# Patient Record
Sex: Male | Born: 1955 | Race: White | Hispanic: No | Marital: Married | Smoking: Former smoker
Health system: Southern US, Community
[De-identification: ages and names within clinical notes are randomized; demographics above are authoritative.]

## PROBLEM LIST (undated history)

## (undated) DIAGNOSIS — E785 Hyperlipidemia, unspecified: Secondary | ICD-10-CM

## (undated) DIAGNOSIS — D649 Anemia, unspecified: Secondary | ICD-10-CM

## (undated) DIAGNOSIS — I4510 Unspecified right bundle-branch block: Secondary | ICD-10-CM

## (undated) DIAGNOSIS — R0989 Other specified symptoms and signs involving the circulatory and respiratory systems: Secondary | ICD-10-CM

## (undated) DIAGNOSIS — I2510 Atherosclerotic heart disease of native coronary artery without angina pectoris: Secondary | ICD-10-CM

## (undated) HISTORY — DX: Atherosclerotic heart disease of native coronary artery without angina pectoris: I25.10

## (undated) HISTORY — DX: Unspecified right bundle-branch block: I45.10

## (undated) HISTORY — DX: Hyperlipidemia, unspecified: E78.5

## (undated) HISTORY — DX: Anemia, unspecified: D64.9

## (undated) HISTORY — DX: Other specified symptoms and signs involving the circulatory and respiratory systems: R09.89

## (undated) HISTORY — PX: TONSILLECTOMY: SUR1361

## (undated) HISTORY — PX: CARDIAC CATHETERIZATION: SHX172

---

## 1999-05-11 ENCOUNTER — Emergency Department (HOSPITAL_COMMUNITY): Admission: EM | Admit: 1999-05-11 | Discharge: 1999-05-11 | Payer: Self-pay | Admitting: Internal Medicine

## 1999-05-11 ENCOUNTER — Encounter: Payer: Self-pay | Admitting: Internal Medicine

## 2001-08-02 ENCOUNTER — Encounter: Payer: Self-pay | Admitting: Family Medicine

## 2001-08-02 ENCOUNTER — Ambulatory Visit (HOSPITAL_COMMUNITY): Admission: RE | Admit: 2001-08-02 | Discharge: 2001-08-02 | Payer: Self-pay | Admitting: Family Medicine

## 2002-01-06 HISTORY — PX: CORONARY ANGIOPLASTY: SHX604

## 2006-03-17 ENCOUNTER — Encounter: Admission: RE | Admit: 2006-03-17 | Discharge: 2006-03-17 | Payer: Self-pay | Admitting: Cardiology

## 2006-03-19 ENCOUNTER — Ambulatory Visit (HOSPITAL_COMMUNITY): Admission: RE | Admit: 2006-03-19 | Discharge: 2006-03-20 | Payer: Self-pay | Admitting: Cardiology

## 2007-11-25 IMAGING — CR DG CHEST 2V
2 series · 2 of 2 positions shown · non-contrast
Comparison: None.

CLINICAL DATA: Angina.
 CHEST - TWO VIEWS:

[view not recorded (1 of 2)]
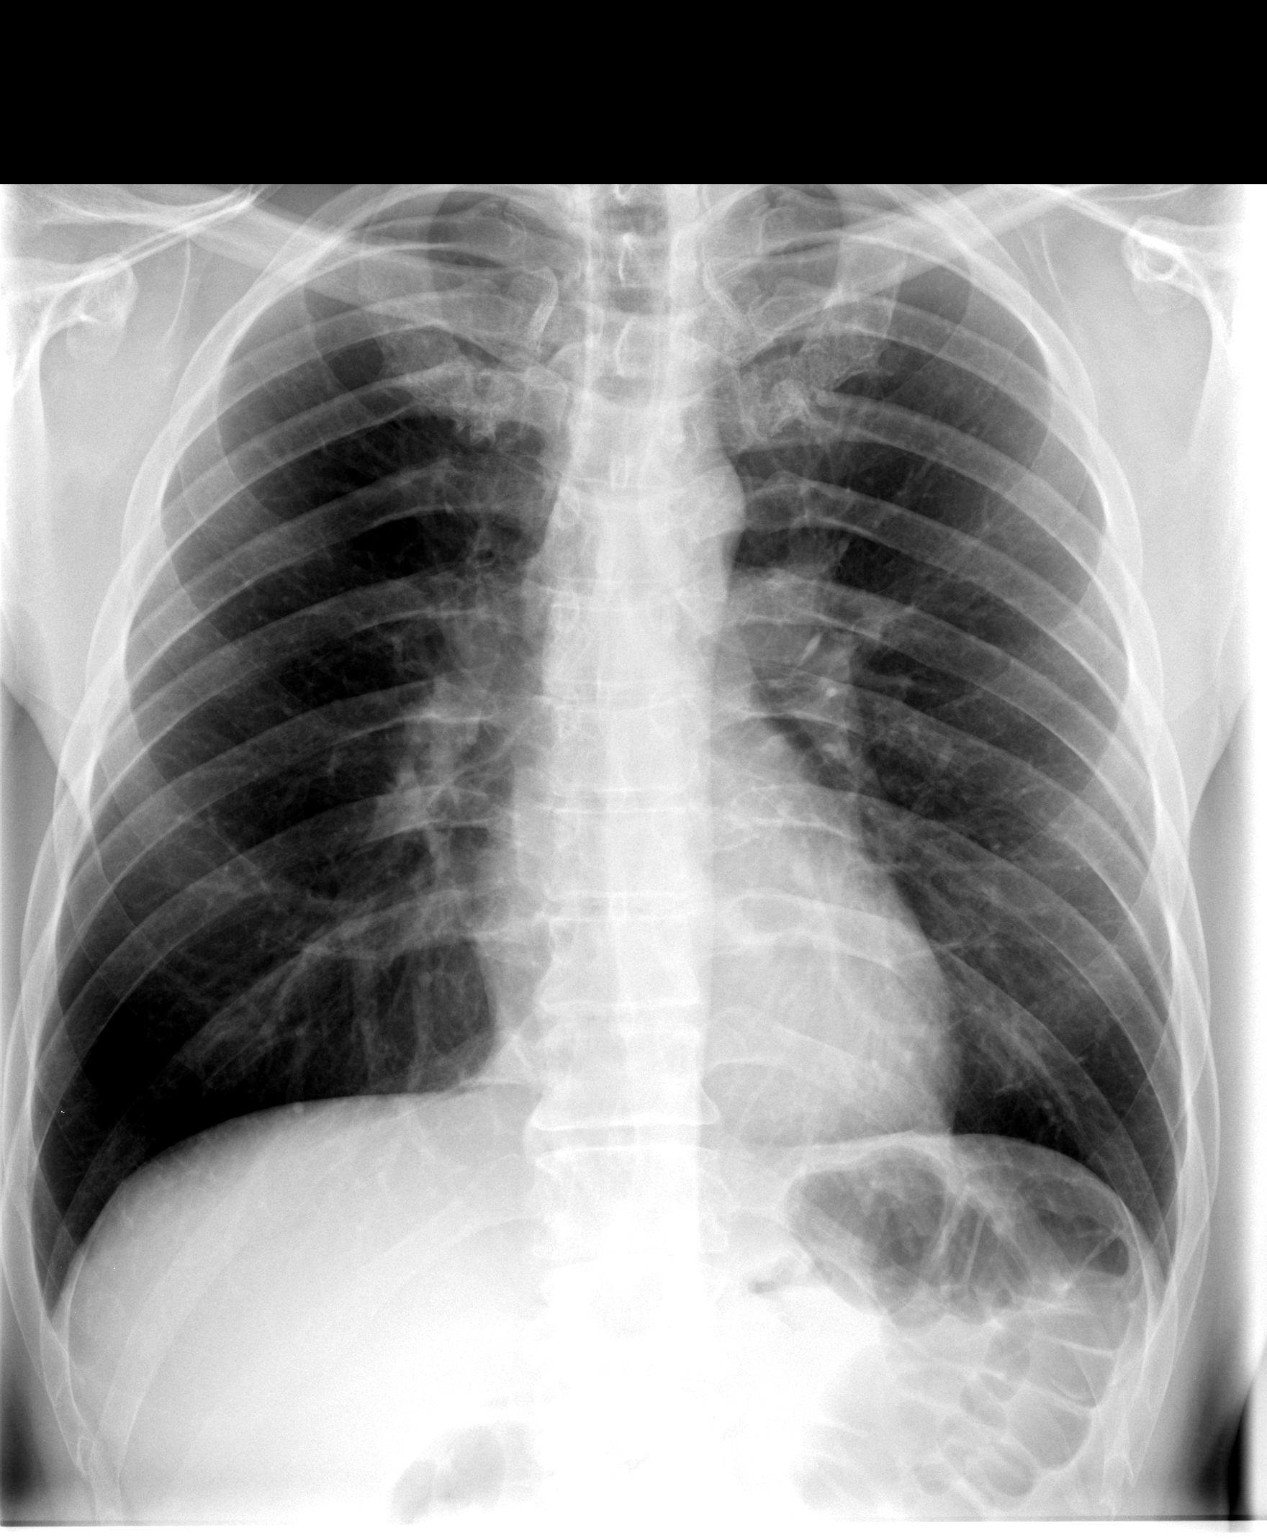

[view not recorded (2 of 2)]
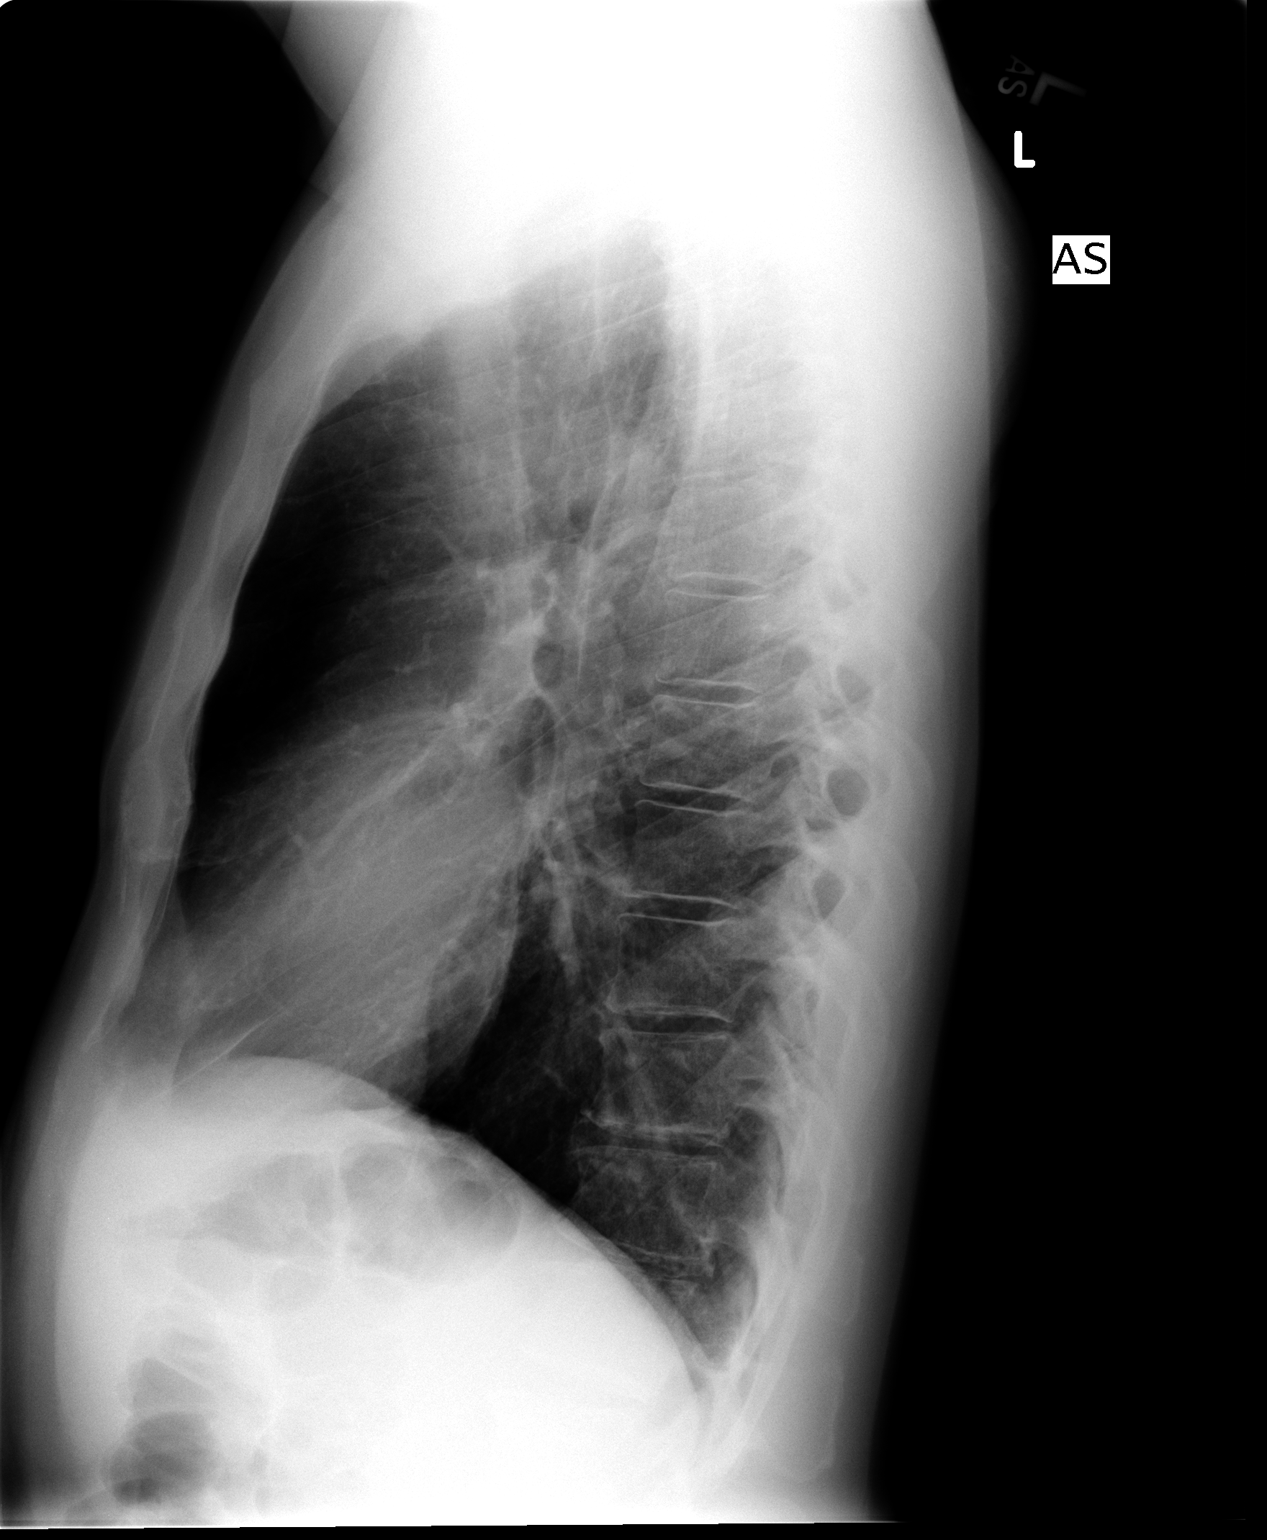

[2 of 2 positions shown; findings below may reference images not displayed]

FINDINGS: .  The lungs are hyperinflated and clear.  The cardiomediastinal silhouette is within normal limits.  There are no pneumothoraces or effusions seen.  The thoracic spine is intact.
IMPRESSION: Hyperinflation, a feature of COPD.

## 2012-10-09 ENCOUNTER — Encounter: Payer: Self-pay | Admitting: *Deleted

## 2012-10-09 ENCOUNTER — Encounter: Payer: Self-pay | Admitting: Dermatology

## 2012-10-09 DIAGNOSIS — I2510 Atherosclerotic heart disease of native coronary artery without angina pectoris: Secondary | ICD-10-CM | POA: Insufficient documentation

## 2012-10-09 DIAGNOSIS — E785 Hyperlipidemia, unspecified: Secondary | ICD-10-CM | POA: Insufficient documentation

## 2012-10-09 DIAGNOSIS — R0989 Other specified symptoms and signs involving the circulatory and respiratory systems: Secondary | ICD-10-CM | POA: Insufficient documentation

## 2012-10-14 ENCOUNTER — Ambulatory Visit: Payer: Self-pay | Admitting: Cardiology

## 2012-10-16 ENCOUNTER — Encounter: Payer: Self-pay | Admitting: Cardiology

## 2012-10-20 ENCOUNTER — Ambulatory Visit: Payer: Self-pay | Admitting: Cardiology

## 2012-11-15 ENCOUNTER — Encounter: Payer: Self-pay | Admitting: Cardiology

## 2012-11-15 ENCOUNTER — Ambulatory Visit (INDEPENDENT_AMBULATORY_CARE_PROVIDER_SITE_OTHER): Payer: BC Managed Care – PPO | Admitting: Cardiology

## 2012-11-15 VITALS — BP 120/64 | HR 66 | Ht 69.0 in | Wt 142.0 lb

## 2012-11-15 DIAGNOSIS — I2510 Atherosclerotic heart disease of native coronary artery without angina pectoris: Secondary | ICD-10-CM

## 2012-11-15 DIAGNOSIS — E785 Hyperlipidemia, unspecified: Secondary | ICD-10-CM

## 2012-11-15 NOTE — Progress Notes (Signed)
  [address] [place], [place]  [address] Phone: [phone] Fax:  [phone]  Date:  11/15/2012   ID:  Donald Fleming, DOB 1955-04-14, MRN [id number]  PCP:  Lolita Patella, MD  Cardiologist:  Armanda Magic, MD     History of Present Illness: Donald Fleming is a 57 y.o. male with a history of ASCAD, HTN and dyslipidemia who presents today for followup.  He denies any chest pain, SOB, DOE, LE edema, palpitations or syncope.  Occasionally he will have some feelings of off balance with the room that seems to come with changes in weather.  He walks for exercise.   Wt Readings from Last 3 Encounters:  11/15/12 142 lb (64.411 kg)     Past Medical History  Diagnosis Date  . Hypercholesteremia   . Coronary atherosclerosis of native coronary artery   . CAD (coronary artery disease)     status post PCI of the LAD diagonal with residual nonobstructive disease of the RCA/80% proximal stenosis a very small ramus  . Dyslipidemia   . Bruit     Current Outpatient Prescriptions  Medication Sig Dispense Refill  . aspirin 325 MG tablet Take 325 mg by mouth daily.      Marland Kitchen atorvastatin (LIPITOR) 40 MG tablet Take 40 mg by mouth daily.      . clopidogrel (PLAVIX) 75 MG tablet Take 75 mg by mouth daily.      . vitamin C (ASCORBIC ACID) 500 MG tablet Take 500 mg by mouth daily.       No current facility-administered medications for this visit.    Allergies:   No Known Allergies  Social History:  The patient  reports that he has quit smoking. He does not have any smokeless tobacco history on file.   Family History:  The patient's family history includes Diabetes in his sister; Heart attack in his father; Heart disease in his father and mother; Pulmonary embolism in his father.   ROS:  Please see the history of present illness.      All other systems reviewed and negative.   PHYSICAL EXAM: VS:  BP 120/64  Pulse 66  Ht 5\' 9"  (1.753 m)  Wt 142 lb (64.411 kg)  BMI 20.96  kg/m2 Well nourished, well developed, in no acute distress HEENT: normal Neck: no JVD Cardiac:  normal S1, S2; RRR; no murmur Lungs:  clear to auscultation bilaterally, no wheezing, rhonchi or rales Abd: soft, nontender, no hepatomegaly Ext: no edema Skin: warm and dry Neuro:  CNs 2-12 intact, no focal abnormalities noted  EKG:  NSR with IRBBB     ASSESSMENT AND PLAN:  1. ASCAD with no angina  - continue ASA/Plaivx 2. Dyslipidemia - LDL was 49  04/2012  - continue Atorvastatin Followup with me in 1 year  Signed, Armanda Magic, MD 11/15/2012 3:39 PM

## 2012-11-15 NOTE — Patient Instructions (Signed)
Your physician recommends that you continue on your current medications as directed. Please refer to the Current Medication list given to you today.  Your physician wants you to follow-up in: 1 year with Dr. Turner. You will receive a reminder letter in the mail two months in advance. If you don't receive a letter, please call our office to schedule the follow-up appointment.  

## 2013-01-25 ENCOUNTER — Other Ambulatory Visit: Payer: Self-pay | Admitting: Cardiology

## 2013-02-23 ENCOUNTER — Telehealth: Payer: Self-pay | Admitting: Cardiology

## 2013-02-23 ENCOUNTER — Encounter: Payer: Self-pay | Admitting: General Surgery

## 2013-02-23 NOTE — Telephone Encounter (Signed)
Spoke to pt and gave him phone number for mychart support.

## 2013-02-23 NOTE — Telephone Encounter (Signed)
New message     Can't get into mychart

## 2013-02-23 NOTE — Telephone Encounter (Signed)
Printed letter with new code and mailed to pt. Pt is aware.

## 2013-02-23 NOTE — Telephone Encounter (Signed)
Follow up    Need activation code for my chart.  OK to leave on vm

## 2013-03-10 ENCOUNTER — Telehealth: Payer: Self-pay | Admitting: Cardiology

## 2013-03-10 NOTE — Telephone Encounter (Signed)
New Message   Pt is requesting a call back to discuss current BP and cholesterol levels// Please call

## 2013-03-11 NOTE — Telephone Encounter (Signed)
Spoke with pt and gave him results that was needed.

## 2013-03-14 ENCOUNTER — Other Ambulatory Visit: Payer: Self-pay | Admitting: Cardiology

## 2013-04-07 ENCOUNTER — Ambulatory Visit (INDEPENDENT_AMBULATORY_CARE_PROVIDER_SITE_OTHER): Payer: BC Managed Care – PPO | Admitting: *Deleted

## 2013-04-07 DIAGNOSIS — I2510 Atherosclerotic heart disease of native coronary artery without angina pectoris: Secondary | ICD-10-CM

## 2013-04-07 DIAGNOSIS — E785 Hyperlipidemia, unspecified: Secondary | ICD-10-CM

## 2013-04-07 DIAGNOSIS — E7800 Pure hypercholesterolemia, unspecified: Secondary | ICD-10-CM

## 2013-04-07 LAB — HEPATIC FUNCTION PANEL
ALBUMIN: 3.1 g/dL — AB (ref 3.5–5.2)
ALT: 13 U/L (ref 0–53)
AST: 14 U/L (ref 0–37)
Alkaline Phosphatase: 73 U/L (ref 39–117)
Bilirubin, Direct: 0.1 mg/dL (ref 0.0–0.3)
TOTAL PROTEIN: 7.1 g/dL (ref 6.0–8.3)
Total Bilirubin: 0.7 mg/dL (ref 0.3–1.2)

## 2013-04-07 LAB — LIPID PANEL
CHOL/HDL RATIO: 2
CHOLESTEROL: 102 mg/dL (ref 0–200)
HDL: 42.2 mg/dL (ref >39.00)
LDL Cholesterol: 56 mg/dL (ref 0–99)
TRIGLYCERIDES: 18 mg/dL (ref 0.0–149.0)
VLDL: 3.6 mg/dL (ref 0.0–40.0)

## 2013-04-12 ENCOUNTER — Other Ambulatory Visit: Payer: Self-pay | Admitting: General Surgery

## 2013-04-12 DIAGNOSIS — E7800 Pure hypercholesterolemia, unspecified: Secondary | ICD-10-CM

## 2013-08-15 ENCOUNTER — Other Ambulatory Visit: Payer: Self-pay | Admitting: Cardiology

## 2013-08-21 ENCOUNTER — Encounter: Payer: Self-pay | Admitting: *Deleted

## 2013-10-11 ENCOUNTER — Other Ambulatory Visit: Payer: Self-pay | Admitting: Cardiology

## 2013-10-12 ENCOUNTER — Other Ambulatory Visit (INDEPENDENT_AMBULATORY_CARE_PROVIDER_SITE_OTHER): Payer: BC Managed Care – PPO | Admitting: *Deleted

## 2013-10-12 DIAGNOSIS — E7800 Pure hypercholesterolemia, unspecified: Secondary | ICD-10-CM

## 2013-10-12 LAB — LIPID PANEL
CHOL/HDL RATIO: 3
Cholesterol: 86 mg/dL (ref 0–200)
HDL: 33.7 mg/dL — ABNORMAL LOW (ref >39.00)
LDL CALC: 48 mg/dL (ref 0–99)
NONHDL: 52.3
Triglycerides: 20 mg/dL (ref 0.0–149.0)
VLDL: 4 mg/dL (ref 0.0–40.0)

## 2013-10-12 LAB — HEPATIC FUNCTION PANEL
ALT: 11 U/L (ref 0–53)
AST: 16 U/L (ref 0–37)
Albumin: 2.7 g/dL — ABNORMAL LOW (ref 3.5–5.2)
Alkaline Phosphatase: 69 U/L (ref 39–117)
Bilirubin, Direct: 0.1 mg/dL (ref 0.0–0.3)
Total Bilirubin: 0.6 mg/dL (ref 0.2–1.2)
Total Protein: 7 g/dL (ref 6.0–8.3)

## 2013-10-13 ENCOUNTER — Telehealth: Payer: Self-pay

## 2013-10-13 MED ORDER — ATORVASTATIN CALCIUM 40 MG PO TABS: 40.0000 mg | ORAL_TABLET | Freq: Every day | ORAL | Status: DC

## 2013-10-13 MED ORDER — CLOPIDOGREL BISULFATE 75 MG PO TABS: 75.0000 mg | ORAL_TABLET | Freq: Every day | ORAL | Status: DC

## 2013-10-13 NOTE — Telephone Encounter (Signed)
Given patient lab results. Also refilled his lipitor 40 mg and Plavix 75mg  for 6 months.

## 2013-11-16 ENCOUNTER — Ambulatory Visit: Payer: BC Managed Care – PPO | Admitting: Cardiology

## 2013-11-25 ENCOUNTER — Ambulatory Visit (INDEPENDENT_AMBULATORY_CARE_PROVIDER_SITE_OTHER): Payer: BC Managed Care – PPO | Admitting: Cardiology

## 2013-11-25 ENCOUNTER — Encounter: Payer: Self-pay | Admitting: Cardiology

## 2013-11-25 VITALS — BP 110/62 | HR 68 | Ht 69.0 in | Wt 142.0 lb

## 2013-11-25 DIAGNOSIS — E7800 Pure hypercholesterolemia, unspecified: Secondary | ICD-10-CM

## 2013-11-25 DIAGNOSIS — I2510 Atherosclerotic heart disease of native coronary artery without angina pectoris: Secondary | ICD-10-CM

## 2013-11-25 DIAGNOSIS — E785 Hyperlipidemia, unspecified: Secondary | ICD-10-CM

## 2013-11-25 DIAGNOSIS — I2583 Coronary atherosclerosis due to lipid rich plaque: Principal | ICD-10-CM

## 2013-11-25 MED ORDER — ASPIRIN 81 MG PO TABS: 81.0000 mg | ORAL_TABLET | Freq: Every day | ORAL | Status: DC

## 2013-11-25 NOTE — Patient Instructions (Signed)
Your physician has recommended you make the following change in your medication:  1) DECREASE Aspirin to 81 mg daily.  Your physician wants you to follow-up in: 1 year with Dr. Mayford Knifeurner. You will receive a reminder letter in the mail two months in advance. If you don't receive a letter, please call our office to schedule the follow-up appointment.

## 2013-11-25 NOTE — Progress Notes (Signed)
  [address] [place], [place]  [address] Phone: [phone](336) [phone] Fax:  [phone]) [phone]  Date:  11/25/2013   ID:  Donald Fleming, DOB 06/08/1955, MRN [id number]  PCP:  Lolita PatellaEADE,ROBERT ALEXANDER, MD  Cardiologist:  Armanda Magicraci Turner, MD    History of Present Illness: Donald Fleming is a 58 y.o. male with a history of ASCAD, HTN and dyslipidemia who presents today for followup. He denies any chest pain, SOB, DOE, LE edema, palpitations or syncope.  He walks for exercise.   Wt Readings from Last 3 Encounters:  11/15/12 142 lb (64.411 kg)     Past Medical History  Diagnosis Date  . Hypercholesteremia   . Coronary atherosclerosis of native coronary artery   . CAD (coronary artery disease)     status post PCI of the LAD diagonal with residual nonobstructive disease of the RCA/80% proximal stenosis a very small ramus  . Dyslipidemia   . Bruit     Current Outpatient Prescriptions  Medication Sig Dispense Refill  . aspirin 325 MG tablet Take 325 mg by mouth daily.    Marland Kitchen. atorvastatin (LIPITOR) 40 MG tablet Take 1 tablet (40 mg total) by mouth daily at 6 PM. 30 tablet 5  . clopidogrel (PLAVIX) 75 MG tablet Take 1 tablet (75 mg total) by mouth daily. 30 tablet 5  . vitamin C (ASCORBIC ACID) 500 MG tablet Take 500 mg by mouth daily.     No current facility-administered medications for this visit.    Allergies:   No Known Allergies  Social History:  The patient  reports that he has quit smoking. He does not have any smokeless tobacco history on file.   Family History:  The patient's family history includes Diabetes in his sister; Heart attack in his father; Heart disease in his father and mother; Pulmonary embolism in his father.   ROS:  Please see the history of present illness.      All other systems reviewed and negative.   PHYSICAL EXAM: VS:  There were no vitals taken for this visit. Well nourished, well developed, in no acute distress HEENT: normal Neck: no JVD Cardiac:   normal S1, S2; RRR; no murmur Lungs:  clear to auscultation bilaterally, no wheezing, rhonchi or rales Abd: soft, nontender, no hepatomegaly Ext: no edema Skin: warm and dry Neuro:  CNs 2-12 intact, no focal abnormalities noted  EKG:     NSR with no ST changes  ASSESSMENT AND PLAN:  1. ASCAD with no angina - continue ASA/Plavix  - decrease ASA to 81mg  daily 2. Dyslipidemia - LDL was 48 10/2013 - continue Atorvastatin Followup with me in 1 year   Signed, Armanda Magicraci Turner, MD [facility] 11/25/2013 9:47 AM

## 2014-01-06 DIAGNOSIS — D649 Anemia, unspecified: Secondary | ICD-10-CM

## 2014-01-06 HISTORY — DX: Anemia, unspecified: D64.9

## 2014-06-09 ENCOUNTER — Other Ambulatory Visit: Payer: Self-pay | Admitting: Cardiology

## 2014-08-07 HISTORY — PX: COLONOSCOPY: SHX174

## 2014-08-16 ENCOUNTER — Encounter: Payer: Self-pay | Admitting: Cardiology

## 2014-09-12 ENCOUNTER — Telehealth: Payer: Self-pay | Admitting: *Deleted

## 2014-09-12 ENCOUNTER — Ambulatory Visit (INDEPENDENT_AMBULATORY_CARE_PROVIDER_SITE_OTHER): Payer: BC Managed Care – PPO | Admitting: Cardiology

## 2014-09-12 ENCOUNTER — Encounter: Payer: Self-pay | Admitting: *Deleted

## 2014-09-12 ENCOUNTER — Encounter: Payer: Self-pay | Admitting: Cardiology

## 2014-09-12 VITALS — BP 96/44 | HR 82 | Ht 68.0 in | Wt 135.1 lb

## 2014-09-12 DIAGNOSIS — I2510 Atherosclerotic heart disease of native coronary artery without angina pectoris: Secondary | ICD-10-CM

## 2014-09-12 DIAGNOSIS — E785 Hyperlipidemia, unspecified: Secondary | ICD-10-CM

## 2014-09-12 DIAGNOSIS — K4021 Bilateral inguinal hernia, without obstruction or gangrene, recurrent: Secondary | ICD-10-CM | POA: Diagnosis not present

## 2014-09-12 DIAGNOSIS — I2583 Coronary atherosclerosis due to lipid rich plaque: Principal | ICD-10-CM

## 2014-09-12 NOTE — Patient Instructions (Addendum)
Medication Instructions:  Your physician recommends that you continue on your current medications as directed. Please refer to the Current Medication list given to you today.   Labwork: None ordered  Testing/Procedures: None ordered  Follow-Up: Your physician wants you to follow-up in:  2 - 3 months with Dr. Mayford Knife.   You have been cleared for your surgery.  Dr. Maris Berger office will be notified.

## 2014-09-12 NOTE — Progress Notes (Signed)
Cardiology Office Note   Date:  09/12/2014   ID:  Donald Fleming, DOB April 28, 1955, MRN [id number]  PCP:  Lolita Patella, MD  Cardiologist:  Dr. Mayford Knife    Chief Complaint  Patient presents with  . Pre-op Exam    hx CAD, no chest pain      History of Present Illness: Donald Fleming is a 59 y.o. male who presents for cardiac risk eval for Lt inguinal repair and placement of mesh by Dr. Abbey Chatters.    Pt with a history of ASCAD with PCI of LAD, and residual nonobstructive disease of RCA 80% in 2004, HTN and dyslipidemia who presents today for risk stratification for inguinal hernia repair. He denies any chest pain, SOB, DOE, LE edema, palpitations or syncope. He has done very well since 2004.  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  He has no pain or SOB with these activities.    Past Medical History  Diagnosis Date  . Hypercholesteremia   . Coronary atherosclerosis of native coronary artery   . CAD (coronary artery disease)     status post PCI of the LAD diagonal with residual nonobstructive disease of the RCA/80% proximal stenosis a very small ramus  . Dyslipidemia   . Bruit     History reviewed. No pertinent past surgical history.   Current Outpatient Prescriptions  Medication Sig Dispense Refill  . aspirin 81 MG tablet Take 1 tablet (81 mg total) by mouth daily.    Marland Kitchen atorvastatin (LIPITOR) 40 MG tablet TAKE ONE (1) TABLET BY MOUTH EVERY DAY AT 6PM 30 tablet 3  . clopidogrel (PLAVIX) 75 MG tablet TAKE ONE (1) TABLET BY MOUTH EVERY DAY 30 tablet 3  . vitamin C (ASCORBIC ACID) 500 MG tablet Take 500 mg by mouth daily.     No current facility-administered medications for this visit.    Allergies:   Review of patient's allergies indicates no known allergies.    Social History:  The patient  reports that he has quit smoking. He does not have any smokeless tobacco history on file.   Family History:  The patient's family history includes Diabetes  in his sister; Heart attack in his father; Heart disease in his father and mother; Pulmonary embolism in his father.    ROS:  General:no colds or fevers, some weight loss Skin:no rashes or ulcers HEENT:no blurred vision, no congestion CV:see HPI PUL:see HPI GI:no diarrhea constipation or melena, no indigestion GU:no hematuria, no dysuria MS:no joint pain, no claudication Neuro:no syncope, no lightheadedness Endo:no diabetes, no thyroid disease  Wt Readings from Last 3 Encounters:  09/12/14 135 lb 1.9 oz (61.29 kg)  11/25/13 142 lb (64.411 kg)  11/15/12 142 lb (64.411 kg)     PHYSICAL EXAM: VS:  BP 96/44 mmHg  Pulse 82  Ht 5\' 8"  (1.727 m)  Wt 135 lb 1.9 oz (61.29 kg)  BMI 20.55 kg/m2  SpO2 97% , BMI Body mass index is 20.55 kg/(m^2). General:Pleasant affect, NAD Skin:Warm and dry, brisk capillary refill HEENT:normocephalic, sclera clear, mucus membranes moist Neck:supple, no JVD, no bruits  Heart:S1S2 RRR with soft systolic murmur 1/VI, no gallup, rub or click Lungs:clear without rales, rhonchi, or wheezes NWG:NFAO, non tender, + BS, do not palpate liver spleen or masses Ext:no lower ext edema, 2+ pedal pulses, 2+ radial pulses Neuro:alert and oriented, MAE, follows commands, + facial symmetry    EKG:  EKG is ordered today. The ekg ordered today demonstrates SR with incomplete  RBBB and no changes from EKG 11/2013.     Recent Labs: 10/12/2013: ALT 11    Lipid Panel    Component Value Date/Time   CHOL 86 10/12/2013 0749   TRIG 20.0 10/12/2013 0749   HDL 33.70* 10/12/2013 0749   CHOLHDL 3 10/12/2013 0749   VLDL 4.0 10/12/2013 0749   LDLCALC 48 10/12/2013 0749       Other studies Reviewed: Additional studies/ records that were reviewed today include: previous notes.   ASSESSMENT AND PLAN:  1. CAD with PCI to LAD and residual RCA stenosis. Treated medically.  Dicussed with the DOD Dr. Elease Hashimoto and pt is moderate risk for surgery.  His EKG is without change and  no chest pain.  Would stop Plavix 7 days prior to surgery. Continue ASA.  Would like to resume Plavix when stable pos op.     He will follow up with Dr. Mayford Knife in 2-3 months.   2. Dyslipidemia has been controlled, followed by PCP.    Current medicines are reviewed with the patient today.  The patient Has no concerns regarding medicines.  The following changes have been made:  See above Labs/ tests ordered today include:see above  Disposition:   FU:  see above  Signed, Leone Brand, NP  09/12/2014 5:07 PM    [facility] [address] [place], [place], [place]  [address]/ [address] [place], [place] Phone: [phone]; Fax: [phone]  [phone]

## 2014-09-12 NOTE — Telephone Encounter (Signed)
Called [facility] and spoke to Dr. Maris Berger nurse, Milas Hock, CMA, to notify her that pt has been cleared for surgery.

## 2014-09-15 ENCOUNTER — Encounter: Payer: Self-pay | Admitting: General Surgery

## 2014-09-15 NOTE — Progress Notes (Signed)
Donald Guild. [name] DOB: 1955/07/02 Married / Language: English / Race: White Male  History of Present IllnessPatient words: hernia.  The patient is a 59 year old male   Note:He was referred by Dr. Nicholos Johns because of a left inguinal hernia. He is retired. He does do a lot of heavy lifting. He was in the shower and looked down and noted a left inguinal bulge area it is completely asymptomatic. No difficulty with urination or constipation. Dr. Nicholos Johns noticed this on exam and was consistent with inguinal hernia. He presents here to discuss further treatment of that. His mother had an umbilical hernia. Of note is that he has 4 coronary artery stents placed in 2004. He takes aspirin and Plavix. Dr. Carolanne Grumbling is his cardiologist. He has not had a colonoscopy and is getting this arranged   Other Problems  CAD Chest pain Hypercholesterolemia Other disease, cancer, significant illness  Past Surgical History Vasectomy  Allergies Lamar Laundry Bynum, CMA; 07/24/2014 1:44 PM) No Known Drug Allergies07/18/2016  Medication History (Sonya Bynum, CMA; 07/24/2014 1:45 PM) Clopidogrel Bisulfate (75MG  Tablet, Oral) Active. Atorvastatin Calcium (40MG  Tablet, Oral) Active. Aspirin EC (81MG  Tablet DR, Oral) Active. Vitamin C (100MG  Tablet, Oral) Active. Medications Reconciled  Social History Alcohol use Remotely quit alcohol use. Caffeine use Coffee, Tea. No drug use Tobacco use Never smoker.  Family History Diabetes Mellitus Mother, Sister. Heart Disease Father. Heart disease in male family member before age 43  Review of Systems General Not Present- Appetite Loss, Chills, Fatigue, Fever, Night Sweats, Weight Gain and Weight Loss. Skin Present- Change in Wart/Mole. Not Present- Dryness, Hives, Jaundice, New Lesions, Non-Healing Wounds, Rash and Ulcer. HEENT Present- Wears glasses/contact lenses. Not Present- Earache, Hearing Loss, Hoarseness, Nose Bleed, Oral Ulcers, Ringing in  the Ears, Seasonal Allergies, Sinus Pain, Sore Throat, Visual Disturbances and Yellow Eyes. Respiratory Not Present- Bloody sputum, Chronic Cough, Difficulty Breathing, Snoring and Wheezing. Breast Not Present- Breast Mass, Breast Pain, Nipple Discharge and Skin Changes. Cardiovascular Not Present- Chest Pain, Difficulty Breathing Lying Down, Leg Cramps, Palpitations, Rapid Heart Rate, Shortness of Breath and Swelling of Extremities. Gastrointestinal Not Present- Abdominal Pain, Bloating, Bloody Stool, Change in Bowel Habits, Chronic diarrhea, Constipation, Difficulty Swallowing, Excessive gas, Gets full quickly at meals, Hemorrhoids, Indigestion, Nausea, Rectal Pain and Vomiting. Male Genitourinary Not Present- Blood in Urine, Change in Urinary Stream, Frequency, Impotence, Nocturia, Painful Urination, Urgency and Urine Leakage. Musculoskeletal Not Present- Back Pain, Joint Pain, Joint Stiffness, Muscle Pain, Muscle Weakness and Swelling of Extremities. Neurological Not Present- Decreased Memory, Fainting, Headaches, Numbness, Seizures, Tingling, Tremor, Trouble walking and Weakness. Psychiatric Not Present- Anxiety, Bipolar, Change in Sleep Pattern, Depression, Fearful and Frequent crying. Endocrine Not Present- Cold Intolerance, Excessive Hunger, Hair Changes, Heat Intolerance, Hot flashes and New Diabetes. Hematology Present- Easy Bruising and Excessive bleeding. Not Present- Gland problems, HIV and Persistent Infections.  Physical Exam The physical exam findings are as follows: Note:General: WDWN in NAD. Pleasant and cooperative.  HEENT: [place]/AT, no facial masses  CV: RRR, no murmur, no JVD.  CHEST: Breath sounds equal and clear. Respirations nonlabored.  ABDOMEN: Soft, nontender, no scars, no hernias.  GU: Moderate-sized, reducible left inguinal bulge. Small right inguinal bulge. No testicular masses.  MUSCULOSKELETAL: FROM, good muscle tone, no edema, no venous stasis  changes  NEUROLOGIC: Alert and oriented, answers questions appropriately, normal gait and station.  PSYCHIATRIC: Normal mood, affect , and behavior.    Assessment & Plan  BILATERAL RECURRENT INGUINAL HERNIA WITHOUT OBSTRUCTION OR GANGRENE (550.93  K40.21)  Impression: The hernias are asymptomatic. We discussed the natural history of inguinal hernias. He is interested in eventually getting them repaired but I recommended he have his colonoscopy first.  Plan: I recommended laparoscopic bilateral inguinal hernia repair with mesh and he is in agreement with this. I have explained the procedure, risks, and aftercare of inguinal hernia repair. Risks include but are not limited to bleeding, infection, wound problems, anesthesia, recurrence, bladder or intestine injury, urinary retention, testicular dysfunction, chronic pain, mesh problems. He seems to understand and agrees with the plan.

## 2014-10-09 NOTE — Patient Instructions (Addendum)
Lester Crickenberger Balis  10/09/2014   Your procedure is scheduled [id number] 10/19/2014   Report to [facility] Main  Entrance take [place]  elevators to 3rd floor to  Short Stay Center at   0530  AM.  Call this number if you have problems the morning of surgery [phone]   Remember: ONLY 1 PERSON MAY GO WITH YOU TO SHORT STAY TO GET  READY MORNING OF YOUR SURGERY.  Do not eat food or drink liquids :After Midnight.     Take these medicines the morning of surgery with A SIP OF WATER: NONE  DO NOT TAKE ANY DIABETIC MEDICATIONS DAY OF YOUR SURGERY                               You may not have any metal on your body including hair pins and              piercings  Do not wear jewelry, make-up, lotions, powders or perfumes, deodorant             Do not wear nail polish.  Do not shave  48 hours prior to surgery.              Men may shave face and neck.   Do not bring valuables to the hospital. [facility] IS NOT             RESPONSIBLE   FOR VALUABLES.  Contacts, dentures or bridgework may not be worn into surgery.  Leave suitcase in the car. After surgery it may be brought to your room.     Patients discharged the day of surgery will not be allowed to drive home.  Name and phone number of your driver:  Special Instructions: N/A              Please read over the following fact sheets you were given: _____________________________________________________________________             [facility] - Preparing for Surgery Before surgery, you can play an important role.  Because skin is not sterile, your skin needs to be as free of germs as possible.  You can reduce the number of germs on your skin by washing with CHG (chlorahexidine gluconate) soap before surgery.  CHG is an antiseptic cleaner which kills germs and bonds with the skin to continue killing germs even after washing. Please DO NOT use if you have an allergy to CHG or antibacterial soaps.  If your skin becomes  reddened/irritated stop using the CHG and inform your nurse when you arrive at Short Stay. Do not shave (including legs and underarms) for at least 48 hours prior to the first CHG shower.  You may shave your face/neck. Please follow these instructions carefully:  1.  Shower with CHG Soap the night before surgery and the  morning of Surgery.  2.  If you choose to wash your hair, wash your hair first as usual with your  normal  shampoo.  3.  After you shampoo, rinse your hair and body thoroughly to remove the  shampoo.                           4.  Use CHG as you would any other liquid soap.  You can apply chg directly  to the  skin and wash                       Gently with a scrungie or clean washcloth.  5.  Apply the CHG Soap to your body ONLY FROM THE NECK DOWN.   Do not use on face/ open                           Wound or open sores. Avoid contact with eyes, ears mouth and genitals (private parts).                       Wash face,  Genitals (private parts) with your normal soap.             6.  Wash thoroughly, paying special attention to the area where your surgery  will be performed.  7.  Thoroughly rinse your body with warm water from the neck down.  8.  DO NOT shower/wash with your normal soap after using and rinsing off  the CHG Soap.                9.  Pat yourself dry with a clean towel.            10.  Wear clean pajamas.            11.  Place clean sheets on your bed the night of your first shower and do not  sleep with pets. Day of Surgery : Do not apply any lotions/deodorants the morning of surgery.  Please wear clean clothes to the hospital/surgery center.  FAILURE TO FOLLOW THESE INSTRUCTIONS Lupinski RESULT IN THE CANCELLATION OF YOUR SURGERY PATIENT SIGNATURE_________________________________  NURSE SIGNATURE__________________________________  ________________________________________________________________________   Adam Phenix  An incentive spirometer is a tool that  can help keep your lungs clear and active. This tool measures how well you are filling your lungs with each breath. Taking long deep breaths Feuerstein help reverse or decrease the chance of developing breathing (pulmonary) problems (especially infection) following:  A long period of time when you are unable to move or be active. BEFORE THE PROCEDURE   If the spirometer includes an indicator to show your best effort, your nurse or respiratory therapist will set it to a desired goal.  If possible, sit up straight or lean slightly forward. Try not to slouch.  Hold the incentive spirometer in an upright position. INSTRUCTIONS FOR USE  1. Sit on the edge of your bed if possible, or sit up as far as you can in bed or on a chair. 2. Hold the incentive spirometer in an upright position. 3. Breathe out normally. 4. Place the mouthpiece in your mouth and seal your lips tightly around it. 5. Breathe in slowly and as deeply as possible, raising the piston or the ball toward the top of the column. 6. Hold your breath for 3-5 seconds or for as long as possible. Allow the piston or ball to fall to the bottom of the column. 7. Remove the mouthpiece from your mouth and breathe out normally. 8. Rest for a few seconds and repeat Steps 1 through 7 at least 10 times every 1-2 hours when you are awake. Take your time and take a few normal breaths between deep breaths. 9. The spirometer Laborde include an indicator to show your best effort. Use the indicator as a goal to work toward during each repetition. 10. After each set of  10 deep breaths, practice coughing to be sure your lungs are clear. If you have an incision (the cut made at the time of surgery), support your incision when coughing by placing a pillow or rolled up towels firmly against it. Once you are able to get out of bed, walk around indoors and cough well. You [place] stop using the incentive spirometer when instructed by your caregiver.  RISKS AND  COMPLICATIONS  Take your time so you do not get dizzy or light-headed.  If you are in pain, you Stefanko need to take or ask for pain medication before doing incentive spirometry. It is harder to take a deep breath if you are having pain. AFTER USE  Rest and breathe slowly and easily.  It can be helpful to keep track of a log of your progress. Your caregiver can provide you with a simple table to help with this. If you are using the spirometer at home, follow these instructions: [place] IF:   You are having difficultly using the spirometer.  You have trouble using the spirometer as often as instructed.  Your pain medication is not giving enough relief while using the spirometer.  You develop fever of 100.5 F (38.1 C) or higher. SEEK IMMEDIATE MEDICAL CARE IF:   You cough up bloody sputum that had not been present before.  You develop fever of 102 F (38.9 C) or greater.  You develop worsening pain at or near the incision site. MAKE SURE YOU:   Understand these instructions.  Will watch your condition.  Will get help right away if you are not doing well or get worse. Document Released: 05/05/2006 Document Revised: 03/17/2011 Document Reviewed: 07/06/2006 [facility] Patient Information 2014 [place], [place].   ________________________________________________________________________

## 2014-10-11 ENCOUNTER — Encounter (HOSPITAL_COMMUNITY): Payer: Self-pay

## 2014-10-11 ENCOUNTER — Encounter (HOSPITAL_COMMUNITY)
Admission: RE | Admit: 2014-10-11 | Discharge: 2014-10-11 | Disposition: A | Discharge status: Home / Self Care | Payer: BC Managed Care – PPO | Source: Ambulatory Visit | Attending: General Surgery | Admitting: General Surgery

## 2014-10-11 DIAGNOSIS — K4020 Bilateral inguinal hernia, without obstruction or gangrene, not specified as recurrent: Secondary | ICD-10-CM | POA: Diagnosis not present

## 2014-10-11 DIAGNOSIS — Z01818 Encounter for other preprocedural examination: Secondary | ICD-10-CM | POA: Insufficient documentation

## 2014-10-11 LAB — COMPREHENSIVE METABOLIC PANEL
ALT: 16 U/L — AB (ref 17–63)
AST: 18 U/L (ref 15–41)
Albumin: 3.1 g/dL — ABNORMAL LOW (ref 3.5–5.0)
Alkaline Phosphatase: 72 U/L (ref 38–126)
Anion gap: 5 (ref 5–15)
BUN: 15 mg/dL (ref 6–20)
CHLORIDE: 106 mmol/L (ref 101–111)
CO2: 28 mmol/L (ref 22–32)
CREATININE: 0.75 mg/dL (ref 0.61–1.24)
Calcium: 8.2 mg/dL — ABNORMAL LOW (ref 8.9–10.3)
GFR calc non Af Amer: >60 mL/min (ref >60)
Glucose, Bld: 101 mg/dL — ABNORMAL HIGH (ref 65–99)
POTASSIUM: 3.8 mmol/L (ref 3.5–5.1)
SODIUM: 139 mmol/L (ref 135–145)
Total Bilirubin: 0.5 mg/dL (ref 0.3–1.2)
Total Protein: 7.3 g/dL (ref 6.5–8.1)

## 2014-10-11 LAB — CBC WITH DIFFERENTIAL/PLATELET
BASOS ABS: 0.1 10*3/uL (ref 0.0–0.1)
Basophils Relative: 1 %
EOS ABS: 0.2 10*3/uL (ref 0.0–0.7)
EOS PCT: 2 %
HCT: 32.8 % — ABNORMAL LOW (ref 39.0–52.0)
Hemoglobin: 10.9 g/dL — ABNORMAL LOW (ref 13.0–17.0)
Lymphocytes Relative: 19 %
Lymphs Abs: 1.2 10*3/uL (ref 0.7–4.0)
MCH: 31.6 pg (ref 26.0–34.0)
MCHC: 33.2 g/dL (ref 30.0–36.0)
MCV: 95.1 fL (ref 78.0–100.0)
Monocytes Absolute: 1.1 10*3/uL — ABNORMAL HIGH (ref 0.1–1.0)
Monocytes Relative: 18 %
Neutro Abs: 3.7 10*3/uL (ref 1.7–7.7)
Neutrophils Relative %: 60 %
PLATELETS: 225 10*3/uL (ref 150–400)
RBC: 3.45 MIL/uL — AB (ref 4.22–5.81)
RDW: 16.1 % — AB (ref 11.5–15.5)
WBC: 6.2 10*3/uL (ref 4.0–10.5)

## 2014-10-11 LAB — PROTIME-INR
INR: 1.09 (ref 0.00–1.49)
Prothrombin Time: 14.3 seconds (ref 11.6–15.2)

## 2014-10-13 ENCOUNTER — Other Ambulatory Visit: Payer: Self-pay | Admitting: Cardiology

## 2014-10-19 ENCOUNTER — Encounter (HOSPITAL_COMMUNITY): Payer: Self-pay | Admitting: *Deleted

## 2014-10-19 ENCOUNTER — Ambulatory Visit (HOSPITAL_COMMUNITY)
Admission: RE | Admit: 2014-10-19 | Discharge: 2014-10-19 | Disposition: A | Discharge status: Home / Self Care | Payer: BC Managed Care – PPO | Source: Ambulatory Visit | Attending: General Surgery | Admitting: General Surgery

## 2014-10-19 ENCOUNTER — Ambulatory Visit (HOSPITAL_COMMUNITY): Payer: BC Managed Care – PPO | Admitting: Anesthesiology

## 2014-10-19 ENCOUNTER — Encounter (HOSPITAL_COMMUNITY)
Admission: RE | Disposition: A | Discharge status: Home / Self Care | Payer: Self-pay | Source: Ambulatory Visit | Attending: General Surgery

## 2014-10-19 DIAGNOSIS — K4020 Bilateral inguinal hernia, without obstruction or gangrene, not specified as recurrent: Secondary | ICD-10-CM | POA: Insufficient documentation

## 2014-10-19 DIAGNOSIS — Z7982 Long term (current) use of aspirin: Secondary | ICD-10-CM | POA: Insufficient documentation

## 2014-10-19 DIAGNOSIS — E7800 Pure hypercholesterolemia, unspecified: Secondary | ICD-10-CM | POA: Insufficient documentation

## 2014-10-19 DIAGNOSIS — Z87891 Personal history of nicotine dependence: Secondary | ICD-10-CM | POA: Diagnosis not present

## 2014-10-19 DIAGNOSIS — E785 Hyperlipidemia, unspecified: Secondary | ICD-10-CM | POA: Diagnosis not present

## 2014-10-19 DIAGNOSIS — Z79899 Other long term (current) drug therapy: Secondary | ICD-10-CM | POA: Insufficient documentation

## 2014-10-19 DIAGNOSIS — I2510 Atherosclerotic heart disease of native coronary artery without angina pectoris: Secondary | ICD-10-CM | POA: Diagnosis not present

## 2014-10-19 DIAGNOSIS — Z7902 Long term (current) use of antithrombotics/antiplatelets: Secondary | ICD-10-CM | POA: Diagnosis not present

## 2014-10-19 HISTORY — PX: INGUINAL HERNIA REPAIR: SHX194

## 2014-10-19 HISTORY — PX: INSERTION OF MESH: SHX5868

## 2014-10-19 SURGERY — REPAIR, HERNIA, INGUINAL, BILATERAL, LAPAROSCOPIC
Anesthesia: General | Site: Abdomen | Laterality: Bilateral

## 2014-10-19 MED ORDER — BUPIVACAINE-EPINEPHRINE (PF) 0.5% -1:200000 IJ SOLN
INTRAMUSCULAR | Status: AC
  Filled 2014-10-19: qty 30

## 2014-10-19 MED ORDER — MORPHINE SULFATE (PF) 10 MG/ML IV SOLN: 2.0000 mg | INTRAVENOUS | Status: DC | PRN

## 2014-10-19 MED ORDER — PROPOFOL 10 MG/ML IV BOLUS
INTRAVENOUS | Status: AC
  Filled 2014-10-19: qty 20

## 2014-10-19 MED ORDER — DEXAMETHASONE SODIUM PHOSPHATE 10 MG/ML IJ SOLN
INTRAMUSCULAR | Status: DC | PRN
  Administered 2014-10-19: 10 mg via INTRAVENOUS

## 2014-10-19 MED ORDER — ROCURONIUM BROMIDE 100 MG/10ML IV SOLN
INTRAVENOUS | Status: AC
  Filled 2014-10-19: qty 1

## 2014-10-19 MED ORDER — OXYCODONE HCL 5 MG PO TABS: 5.0000 mg | ORAL_TABLET | ORAL | Status: DC | PRN

## 2014-10-19 MED ORDER — BUPIVACAINE-EPINEPHRINE 0.5% -1:200000 IJ SOLN
INTRAMUSCULAR | Status: DC | PRN
  Administered 2014-10-19: 9 mL

## 2014-10-19 MED ORDER — SUGAMMADEX SODIUM 500 MG/5ML IV SOLN
INTRAVENOUS | Status: DC | PRN
  Administered 2014-10-19: 300 mg via INTRAVENOUS

## 2014-10-19 MED ORDER — FENTANYL CITRATE (PF) 250 MCG/5ML IJ SOLN
INTRAMUSCULAR | Status: AC
  Filled 2014-10-19: qty 25

## 2014-10-19 MED ORDER — DEXAMETHASONE SODIUM PHOSPHATE 10 MG/ML IJ SOLN
INTRAMUSCULAR | Status: AC
  Filled 2014-10-19: qty 1

## 2014-10-19 MED ORDER — SODIUM CHLORIDE 0.9 % IJ SOLN: 3.0000 mL | INTRAMUSCULAR | Status: DC | PRN

## 2014-10-19 MED ORDER — SUGAMMADEX SODIUM 200 MG/2ML IV SOLN
INTRAVENOUS | Status: AC
  Filled 2014-10-19: qty 2

## 2014-10-19 MED ORDER — PROPOFOL 10 MG/ML IV BOLUS
INTRAVENOUS | Status: DC | PRN
  Administered 2014-10-19: 150 mg via INTRAVENOUS

## 2014-10-19 MED ORDER — ACETAMINOPHEN 650 MG RE SUPP
650.0000 mg | RECTAL | Status: DC | PRN
  Filled 2014-10-19: qty 1

## 2014-10-19 MED ORDER — CEFAZOLIN SODIUM-DEXTROSE 2-3 GM-% IV SOLR: 2.0000 g | INTRAVENOUS | Status: DC

## 2014-10-19 MED ORDER — ACETAMINOPHEN 325 MG PO TABS: 650.0000 mg | ORAL_TABLET | ORAL | Status: DC | PRN

## 2014-10-19 MED ORDER — ONDANSETRON HCL 4 MG/2ML IJ SOLN
INTRAMUSCULAR | Status: DC | PRN
  Administered 2014-10-19: 4 mg via INTRAVENOUS

## 2014-10-19 MED ORDER — MIDAZOLAM HCL 2 MG/2ML IJ SOLN
INTRAMUSCULAR | Status: AC
  Filled 2014-10-19: qty 4

## 2014-10-19 MED ORDER — PHENYLEPHRINE HCL 10 MG/ML IJ SOLN
INTRAMUSCULAR | Status: DC | PRN
  Administered 2014-10-19 (×4): 80 ug via INTRAVENOUS

## 2014-10-19 MED ORDER — LIDOCAINE HCL (CARDIAC) 20 MG/ML IV SOLN
INTRAVENOUS | Status: DC | PRN
  Administered 2014-10-19: 50 mg via INTRAVENOUS

## 2014-10-19 MED ORDER — FENTANYL CITRATE (PF) 100 MCG/2ML IJ SOLN
INTRAMUSCULAR | Status: DC | PRN
  Administered 2014-10-19: 150 ug via INTRAVENOUS
  Administered 2014-10-19: 50 ug via INTRAVENOUS

## 2014-10-19 MED ORDER — SODIUM CHLORIDE 0.9 % IJ SOLN
INTRAMUSCULAR | Status: AC
  Filled 2014-10-19: qty 10

## 2014-10-19 MED ORDER — HYDROMORPHONE HCL 1 MG/ML IJ SOLN: 0.2500 mg | INTRAMUSCULAR | Status: DC | PRN

## 2014-10-19 MED ORDER — METOCLOPRAMIDE HCL 5 MG/ML IJ SOLN
INTRAMUSCULAR | Status: AC
  Filled 2014-10-19: qty 2

## 2014-10-19 MED ORDER — CEFAZOLIN SODIUM-DEXTROSE 2-3 GM-% IV SOLR
INTRAVENOUS | Status: DC | PRN
  Administered 2014-10-19: 2 g via INTRAVENOUS

## 2014-10-19 MED ORDER — ROCURONIUM BROMIDE 100 MG/10ML IV SOLN
INTRAVENOUS | Status: DC | PRN
  Administered 2014-10-19: 10 mg via INTRAVENOUS
  Administered 2014-10-19: 40 mg via INTRAVENOUS
  Administered 2014-10-19: 10 mg via INTRAVENOUS

## 2014-10-19 MED ORDER — ONDANSETRON HCL 4 MG/2ML IJ SOLN
INTRAMUSCULAR | Status: AC
  Filled 2014-10-19: qty 2

## 2014-10-19 MED ORDER — LACTATED RINGERS IV SOLN
INTRAVENOUS | Status: DC | PRN
  Administered 2014-10-19 (×2): via INTRAVENOUS

## 2014-10-19 MED ORDER — MIDAZOLAM HCL 5 MG/5ML IJ SOLN
INTRAMUSCULAR | Status: DC | PRN
  Administered 2014-10-19: 2 mg via INTRAVENOUS

## 2014-10-19 MED ORDER — GLYCOPYRROLATE 0.2 MG/ML IJ SOLN
INTRAMUSCULAR | Status: AC
  Filled 2014-10-19: qty 10

## 2014-10-19 MED ORDER — EPHEDRINE SULFATE 50 MG/ML IJ SOLN
INTRAMUSCULAR | Status: AC
  Filled 2014-10-19: qty 1

## 2014-10-19 MED ORDER — LACTATED RINGERS IV SOLN
INTRAVENOUS | Status: DC | PRN
  Administered 2014-10-19: 1000 mL

## 2014-10-19 MED ORDER — PROMETHAZINE HCL 25 MG/ML IJ SOLN: 6.2500 mg | INTRAMUSCULAR | Status: DC | PRN

## 2014-10-19 MED ORDER — LIDOCAINE HCL (CARDIAC) 20 MG/ML IV SOLN
INTRAVENOUS | Status: AC
Start: 2014-10-19 — End: 2014-10-19
  Filled 2014-10-19: qty 10

## 2014-10-19 MED ORDER — CEFAZOLIN SODIUM-DEXTROSE 2-3 GM-% IV SOLR
INTRAVENOUS | Status: AC
  Filled 2014-10-19: qty 50

## 2014-10-19 MED ORDER — METOCLOPRAMIDE HCL 5 MG/ML IJ SOLN
INTRAMUSCULAR | Status: DC | PRN
  Administered 2014-10-19: 10 mg via INTRAVENOUS

## 2014-10-19 SURGICAL SUPPLY — 38 items
APPLIER CLIP 5 13 M/L LIGAMAX5 (MISCELLANEOUS) ×4
BENZOIN TINCTURE PRP APPL 2/3 (GAUZE/BANDAGES/DRESSINGS) ×4 IMPLANT
CABLE HIGH FREQUENCY MONO STRZ (ELECTRODE) IMPLANT
CHLORAPREP W/TINT 26ML (MISCELLANEOUS) ×4 IMPLANT
CLIP APPLIE 5 13 M/L LIGAMAX5 (MISCELLANEOUS) ×2 IMPLANT
CLOSURE WOUND 1/2 X4 (GAUZE/BANDAGES/DRESSINGS) ×1
COVER SURGICAL LIGHT HANDLE (MISCELLANEOUS) ×4 IMPLANT
DECANTER SPIKE VIAL GLASS SM (MISCELLANEOUS) IMPLANT
DISSECT BALLN SPACEMKR + OVL (BALLOONS) ×4
DISSECTOR BALLN SPACEMKR + OVL (BALLOONS) ×2 IMPLANT
DISSECTOR BLUNT TIP ENDO 5MM (MISCELLANEOUS) ×4 IMPLANT
DRAPE LAPAROSCOPIC ABDOMINAL (DRAPES) ×4 IMPLANT
DRAPE UTILITY XL STRL (DRAPES) ×4 IMPLANT
DRSG TEGADERM 2-3/8X2-3/4 SM (GAUZE/BANDAGES/DRESSINGS) ×4 IMPLANT
ELECT REM PT RETURN 9FT ADLT (ELECTROSURGICAL) ×4
ELECTRODE REM PT RTRN 9FT ADLT (ELECTROSURGICAL) ×2 IMPLANT
GAUZE SPONGE 2X2 8PLY STRL LF (GAUZE/BANDAGES/DRESSINGS) ×2 IMPLANT
GLOVE BIOGEL PI IND STRL 7.0 (GLOVE) ×2 IMPLANT
GLOVE BIOGEL PI INDICATOR 7.0 (GLOVE) ×2
GLOVE ECLIPSE 8.0 STRL XLNG CF (GLOVE) ×4 IMPLANT
GLOVE INDICATOR 8.0 STRL GRN (GLOVE) ×4 IMPLANT
GOWN STRL REUS W/TWL LRG LVL3 (GOWN DISPOSABLE) ×4 IMPLANT
GOWN STRL REUS W/TWL XL LVL3 (GOWN DISPOSABLE) ×12 IMPLANT
KIT BASIN OR (CUSTOM PROCEDURE TRAY) ×4 IMPLANT
MESH HERNIA 6X6 BARD (Mesh General) ×4 IMPLANT
MESH HERNIA BARD 6X6 (Mesh General) ×4 IMPLANT
NEEDLE INSUFFLATION 14GA 120MM (NEEDLE) IMPLANT
PEN SKIN MARKING BROAD (MISCELLANEOUS) ×4 IMPLANT
SCISSORS LAP 5X35 DISP (ENDOMECHANICALS) IMPLANT
SET IRRIG TUBING LAPAROSCOPIC (IRRIGATION / IRRIGATOR) IMPLANT
SOLUTION ANTI FOG 6CC (MISCELLANEOUS) ×4 IMPLANT
SPONGE GAUZE 2X2 STER 10/PKG (GAUZE/BANDAGES/DRESSINGS) ×2
STRIP CLOSURE SKIN 1/2X4 (GAUZE/BANDAGES/DRESSINGS) ×3 IMPLANT
SUT MNCRL AB 4-0 PS2 18 (SUTURE) ×4 IMPLANT
TOWEL OR 17X26 10 PK STRL BLUE (TOWEL DISPOSABLE) ×4 IMPLANT
TRAY LAPAROSCOPIC (CUSTOM PROCEDURE TRAY) ×4 IMPLANT
TROCAR CANNULA W/PORT DUAL 5MM (MISCELLANEOUS) ×8 IMPLANT
TUBING INSUFFLATION 10FT LAP (TUBING) ×4 IMPLANT

## 2014-10-19 NOTE — Transfer of Care (Signed)
Immediate Anesthesia Transfer of Care Note  Patient: Donald Fleming  Procedure(s) Performed: Procedure(s): LAPAROSCOPIC BILATERAL INGUINAL HERNIA REPAIR WITH MESH (Bilateral) INSERTION OF MESH (Bilateral)  Patient Location: PACU  Anesthesia Type:General  Level of Consciousness:  sedated, patient cooperative and responds to stimulation  Airway & Oxygen Therapy:Patient Spontanous Breathing and Patient connected to face mask oxgen  Post-op Assessment:  Report given to PACU RN and Post -op Vital signs reviewed and stable  Post vital signs:  Reviewed and stable  Last Vitals:  Filed Vitals:   10/19/14 0535  BP: 115/62  Pulse: 72  Temp: 36.6 C  Resp: 18    Complications: No apparent anesthesia complications

## 2014-10-19 NOTE — Discharge Instructions (Addendum)
[facility] _______[facility]  UMBILICAL OR INGUINAL HERNIA REPAIR: POST OP INSTRUCTIONS  Always review your discharge instruction sheet given to you by the facility where your surgery was performed. IF YOU HAVE DISABILITY OR FAMILY LEAVE FORMS, YOU MUST BRING THEM TO THE OFFICE FOR PROCESSING.   DO NOT GIVE THEM TO YOUR DOCTOR.  1. A  prescription for pain medication may be given to you upon discharge.  Take your pain medication as prescribed, if needed.  If narcotic pain medicine is not needed, then you may take acetaminophen (Tylenol) or ibuprofen (Advil) as needed. 2. Take your usually prescribed medications unless otherwise directed. 3. If you need a refill on your pain medication, please contact your pharmacy.  They will contact our office to request authorization. Prescriptions will not be filled after 5 pm or on week-ends. 4. You should follow a light diet the first 24 hours after arrival home, such as soup and crackers, etc.  Be sure to include lots of fluids daily.  Resume your normal diet the day after surgery. 5. Most patients will experience some swelling and bruising around the umbilicus or in the groin and scrotum.  Ice packs and reclining will help.  Swelling and bruising can take many days to resolve.  6. It is common to experience some constipation if taking pain medication after surgery.  Increasing fluid intake and taking a stool softener (such as Colace) will usually help or prevent this problem from occurring.  A mild laxative (Milk of Magnesia or Miralax) should be taken according to package directions if there are no bowel movements after 48 hours. 7. Unless discharge instructions indicate otherwise, you may remove your bandages 72 hours after surgery, and you may shower at that time.  You may have steri-strips (small skin tapes) in place directly over the incision.  These strips should be left on the skin.  If your surgeon used skin glue on the incision, you may  shower in 24 hours.  The glue will flake off over the next 2-3 weeks.  Any sutures or staples will be removed at the office during your follow-up visit. 8. ACTIVITIES:  You may resume regular (light) daily activities beginning the next day--such as daily self-care, walking, climbing stairs--gradually increasing activities as tolerated.  You may have sexual intercourse when it is comfortable.  Refrain from any heavy lifting or straining-nothing over 10 pounds for 6 weeks.  a. You may drive when you are no longer taking prescription pain medication, you can comfortably wear a seatbelt, and you can safely maneuver your car and apply brakes. b. RETURN TO WORK:  Desk work/Light work in 1-2 weeks, full duty in 6 weeks._________________________________________________________ 9. You should see your doctor in the office for a follow-up appointment approximately 2-3 weeks after your surgery.  Make sure that you call for this appointment within a day or two after you arrive home to insure a convenient appointment time. 10. OTHER INSTRUCTIONS:  __RESTART ASPIRIN AND PLAVIX ON Sunday, 10/16________________________________________________________________________________________________________________________________________________________________________________________  WHEN TO CALL YOUR DOCTOR: 1. Fever over 101.0 2. Inability to urinate 3. Nausea and/or vomiting 4. Extreme swelling or bruising 5. Continued bleeding from incision. 6. Increased pain, redness, or drainage from the incision  The clinic staff is available to answer your questions during regular business hours.  Please dont hesitate to call and ask to speak to one of the nurses for clinical concerns.  If you have a medical emergency, go to the nearest emergency room or call 911.  A surgeon from [facility] is always on call at the hospital   [address], [address], [place], [place]  [id number] ?  [address] [address], [place],  [place]   [id number] [phone] ? [phone] ? FAX [phone] Web site: [email or web address]   General Anesthesia, Adult, Care After Refer to this sheet in the next few weeks. These instructions provide you with information on caring for yourself after your procedure. Your health care provider may also give you more specific instructions. Your treatment has been planned according to current medical practices, but problems sometimes occur. Call your health care provider if you have any problems or questions after your procedure. WHAT TO EXPECT AFTER THE PROCEDURE After the procedure, it is typical to experience:  Sleepiness.  Nausea and vomiting. HOME CARE INSTRUCTIONS  For the first 24 hours after general anesthesia:  Have a responsible person with you.  Do not drive a car. If you are alone, do not take public transportation.  Do not drink alcohol.  Do not take medicine that has not been prescribed by your health care provider.  Do not sign important papers or make important decisions.  You may resume a normal diet and activities as directed by your health care provider.  Change bandages (dressings) as directed.  If you have questions or problems that seem related to general anesthesia, call the hospital and ask for the anesthetist or anesthesiologist on call. SEEK MEDICAL CARE IF:  You have nausea and vomiting that continue the day after anesthesia.  You develop a rash. SEEK IMMEDIATE MEDICAL CARE IF:   You have difficulty breathing.  You have chest pain.  You have any allergic problems.   This information is not intended to replace advice given to you by your health care provider. Make sure you discuss any questions you have with your health care provider.   Document Released: 03/31/2000 Document Revised: 01/13/2014 Document Reviewed: 04/23/2011 Elsevier Interactive Patient Education [employer]vier Inc.

## 2014-10-19 NOTE — Anesthesia Postprocedure Evaluation (Signed)
  Anesthesia Post-op Note  Patient: Donald Fleming  Procedure(s) Performed: Procedure(s) (LRB): LAPAROSCOPIC BILATERAL INGUINAL HERNIA REPAIR WITH MESH (Bilateral) INSERTION OF MESH (Bilateral)  Patient Location: PACU  Anesthesia Type: General  Level of Consciousness: awake and alert   Airway and Oxygen Therapy: Patient Spontanous Breathing  Post-op Pain: mild  Post-op Assessment: Post-op Vital signs reviewed, Patient's Cardiovascular Status Stable, Respiratory Function Stable, Patent Airway and No signs of Nausea or vomiting  Last Vitals:  Filed Vitals:   10/19/14 1011  BP: 125/61  Pulse: 76  Temp: 36.4 C  Resp: 14    Post-op Vital Signs: stable   Complications: No apparent anesthesia complications

## 2014-10-19 NOTE — H&P (Signed)
Donald Fleming is an 59 y.o. male.   Chief Complaint:  Here for elective hernia repair HPI:  He was referred by Dr. Nicholos Johnseade because of a left inguinal hernia and was found to have bilateral inguinal hernias.He is retired. He does do a lot of heavy lifting.   Past Medical History  Diagnosis Date  . Hypercholesteremia   . Coronary atherosclerosis of native coronary artery   . CAD (coronary artery disease)     status post PCI of the LAD diagonal with residual nonobstructive disease of the RCA/80% proximal stenosis a very small ramus  . Dyslipidemia   . Bruit     Past Surgical History  Procedure Laterality Date  . Cardiac catheterization    . Coronary angioplasty  2004    x 3 coronary stents  . Tonsillectomy      as child  . Colonoscopy  08/2014    Family History  Problem Relation Age of Onset  . Heart disease Mother   . Heart attack Father   . Heart disease Father   . Pulmonary embolism Father   . Diabetes Sister    Social History:  reports that he quit smoking about 39 years ago. He does not have any smokeless tobacco history on file. He reports that he does not drink alcohol or use illicit drugs.  Allergies: No Known Allergies   Prior to Admission medications   Medication Sig Start Date End Date Taking? Authorizing Provider  aspirin 81 MG tablet Take 1 tablet (81 mg total) by mouth daily. 11/25/13  Yes Quintella Reichertraci R Turner, MD  clopidogrel (PLAVIX) 75 MG tablet TAKE ONE (1) TABLET BY MOUTH EVERY DAY 06/09/14  Yes Quintella Reichertraci R Turner, MD  vitamin C (ASCORBIC ACID) 500 MG tablet Take 500 mg by mouth daily.   Yes Historical Provider, MD  atorvastatin (LIPITOR) 40 MG tablet TAKE ONE (1) TABLET BY MOUTH EVERY DAY AT 6PM 10/13/14   Quintella Reichertraci R Turner, MD     Medications Prior to Admission  Medication Sig Dispense Refill  . aspirin 81 MG tablet Take 1 tablet (81 mg total) by mouth daily.    . clopidogrel (PLAVIX) 75 MG tablet TAKE ONE (1) TABLET BY MOUTH EVERY DAY 30 tablet 3  . vitamin C  (ASCORBIC ACID) 500 MG tablet Take 500 mg by mouth daily.    Marland Kitchen. atorvastatin (LIPITOR) 40 MG tablet TAKE ONE (1) TABLET BY MOUTH EVERY DAY AT 6PM 30 tablet 5    No results found for this or any previous visit (from the past 48 hour(s)). No results found.  Review of Systems  Constitutional: Negative for fever and chills.  Gastrointestinal: Negative for nausea and vomiting.    Blood pressure 115/62, pulse 72, temperature 97.9 F (36.6 C), temperature source Oral, resp. rate 18, height 5\' 8"  (1.727 m), weight 62.596 kg (138 lb), SpO2 100 %. Physical Exam  Constitutional: He appears well-developed and well-nourished. No distress.  Cardiovascular: Normal rate and regular rhythm.   Respiratory: Effort normal and breath sounds normal.  GI: Soft.  No umbilical hernia  Genitourinary:  Moderate sized left inguinal bulge, smaller right inguinal bulge.  Both are reducible.     Assessment/Plan Bilateral inguinal hernias  Plan:  Laparoscopic repair of bilateral inguinal hernias.  Destony Prevost J 10/19/2014, 7:20 AM

## 2014-10-19 NOTE — Anesthesia Preprocedure Evaluation (Signed)
Anesthesia Evaluation  Patient identified by MRN, date of birth, ID band Patient awake    Reviewed: Allergy & Precautions, NPO status , Patient's Chart, lab work & pertinent test results  Airway Mallampati: II  TM Distance: >3 FB Neck ROM: Full    Dental no notable dental hx.    Pulmonary former smoker,    Pulmonary exam normal breath sounds clear to auscultation       Cardiovascular + CAD  Normal cardiovascular exam Rhythm:Regular Rate:Normal     Neuro/Psych negative neurological ROS  negative psych ROS   GI/Hepatic negative GI ROS, Neg liver ROS,   Endo/Other  negative endocrine ROS  Renal/GU negative Renal ROS  negative genitourinary   Musculoskeletal negative musculoskeletal ROS (+)   Abdominal   Peds negative pediatric ROS (+)  Hematology negative hematology ROS (+)   Anesthesia Other Findings   Reproductive/Obstetrics negative OB ROS                             Anesthesia Physical Anesthesia Plan  ASA: III  Anesthesia Plan: General   Post-op Pain Management:    Induction: Intravenous  Airway Management Planned: Oral ETT  Additional Equipment:   Intra-op Plan:   Post-operative Plan: Extubation in OR  Informed Consent: I have reviewed the patients History and Physical, chart, labs and discussed the procedure including the risks, benefits and alternatives for the proposed anesthesia with the patient or authorized representative who has indicated his/her understanding and acceptance.   Dental advisory given  Plan Discussed with: CRNA  Anesthesia Plan Comments:         Anesthesia Quick Evaluation

## 2014-10-19 NOTE — Progress Notes (Signed)
Pt up ambulated down hall, tolerated well.  He is without complaints.  He voided without difficulty.  Pt states he is ready to go home.

## 2014-10-19 NOTE — Op Note (Signed)
Operative Note  Donald Fleming male 59 y.o. 10/19/2014  PREOPERATIVE DX:  Bilateral inguinal hernias POSTOPERATIVE DX:  Same  PROCEDURE:   Laparoscopic repair of bilateral inguinal hernias with mesh (polypropylene 5" x 6" on each side)         Surgeon: Adolph PollackOSENBOWER,Trelon Plush J   Assistants: none  Anesthesia: General endotracheal anesthesia  Indications:   This is a 59 year old male who does a lot of heavy lifting. He noticed a significant sized bulge on the left side consistent with hernia. She also has a smaller hernia on the right side. He now presents for laparoscopic repair.    Procedure Detail:  He was seen in the holding area. He voided. He was brought to the operating room and placed supine on the operating table and a general anesthetic was given. The hair in the lower abdomen and groin was clipped. These areas were then sterilely prepped and draped. A timeout was performed.  Marcaine was infiltrated in the subumbilical region. A small transverse subumbilical incision was made through the skin and subcutaneous tissue. Using blunt dissection, the left anterior rectus sheath was identified and a small incision made in it. The underlying left rectus muscle was swept laterally exposing the posterior rectus sheath. A balloon dissection device was then placed into the lower abdominal area extraperitoneal and under laparoscopic vision balloon dissection was performed. The balloon was removed and CO2 gas was insufflated. Two 5 mm trocars were placed in the lower midline. Using blunt dissection, the symphysis pubis and Cooper's ligament was exposed bilaterally. Beginning on the left side, I exposed a direct hernia defect and reduced its contents. I then dissected the tissue free from the abdominal wall anteriorly and laterally. The spermatic cord was isolated and peritoneum was dissected free from it pulling it back toward the umbilicus.  The right side was approached in a similar fashion I  dissected tissue free from the anterior and lateral abdominal walls. I isolated the spermatic cord. A small direct hernia was seen on this side. I then divided the peritoneum on the cord and dissected back toward the umbilicus.  Two 6" x 6" pieces of polypropylene mesh were brought into the field. One inch was cut off each. A partial longitudinal slit was cut into each. The first piece of mesh was then placed into the left extraperitoneal space and deployed with 2 tails wrapped around the spermatic cord. The mesh was then anchored to Cooper's ligament, the anterior abdominal wall, and lateral abdominal spiral tacks. This provided for good coverage with good overlap of the direct, indirect, and femoral spaces.  Similarly, the second piece of mesh was placed into the right extra peritoneal space and deployed to the 2 tails were wrapped around the spermatic cord. It was then anchored to Cooper's ligament, the anterior abdominal wall, and lateral abdominal wall was spiral tacks. This provided for good coverage with good overlap of the direct, indirect, and femoral spaces.  Hemostasis was adequate at this time. CO2 gas was released and the extraperitoneal contents approximated the mesh. Each trocar was then removed.  The left anterior rectus sheath defect was closed with interrupted 0 Vicryl sutures. Skin incisions were closed with 4 Monocryl subcuticular stitches. Steri-Strips and sterile dressings were applied.  The procedure was well-tolerated without any apparent complications and he was taken to the recovery room in satisfactory condition.

## 2014-10-19 NOTE — Anesthesia Procedure Notes (Signed)
Procedure Name: Intubation Date/Time: 10/19/2014 7:36 AM Performed by: Aren Pryde, Nuala AlphaKRISTOPHER Pre-anesthesia Checklist: Patient identified, Emergency Drugs available, Suction available, Patient being monitored and Timeout performed Patient Re-evaluated:Patient Re-evaluated prior to inductionOxygen Delivery Method: Circle system utilized Preoxygenation: Pre-oxygenation with 100% oxygen Intubation Type: IV induction Ventilation: Mask ventilation without difficulty Laryngoscope Size: Mac and 4 Grade View: Grade II Tube type: Oral Tube size: 7.5 mm Number of attempts: 1 Airway Equipment and Method: Stylet Placement Confirmation: ETT inserted through vocal cords under direct vision,  positive ETCO2,  CO2 detector and breath sounds checked- equal and bilateral Secured at: 22 cm Tube secured with: Tape Dental Injury: Teeth and Oropharynx as per pre-operative assessment

## 2014-11-06 ENCOUNTER — Other Ambulatory Visit: Payer: Self-pay | Admitting: Cardiology

## 2014-11-07 NOTE — Telephone Encounter (Signed)
OK to refill Plavix but needs ok from surgery when it is safe to restart

## 2014-11-07 NOTE — Telephone Encounter (Signed)
Pt requesting a refill on plavix 75 mg daily. plavix was stop 7 days prior to surgery on 10/19/2014 and pt was to resume plavix 75 mg daily when stable pos op, per last OV note on 09/12/2014. Pt has an appointment on 11/23/14. Please advise

## 2014-11-23 ENCOUNTER — Encounter: Payer: Self-pay | Admitting: Cardiology

## 2014-11-23 ENCOUNTER — Ambulatory Visit (INDEPENDENT_AMBULATORY_CARE_PROVIDER_SITE_OTHER): Payer: BC Managed Care – PPO | Admitting: Cardiology

## 2014-11-23 VITALS — BP 102/60 | HR 63 | Ht 68.0 in | Wt 141.8 lb

## 2014-11-23 DIAGNOSIS — E785 Hyperlipidemia, unspecified: Secondary | ICD-10-CM | POA: Diagnosis not present

## 2014-11-23 DIAGNOSIS — I2510 Atherosclerotic heart disease of native coronary artery without angina pectoris: Secondary | ICD-10-CM | POA: Diagnosis not present

## 2014-11-23 DIAGNOSIS — I2583 Coronary atherosclerosis due to lipid rich plaque: Principal | ICD-10-CM

## 2014-11-23 NOTE — Progress Notes (Signed)
Cardiology Office Note   Date:  11/23/2014   ID:  Donald Fleming, DOB [birth year], MRN [id number]  PCP:  Lolita Patella, MD    Chief Complaint  Patient presents with  . Coronary Artery Disease  . Hyperlipidemia      History of Present Illness: Donald Fleming is a 59 y.o. male with a history of ASCAD, HTN and dyslipidemia who presents today for followup. He denies any chest pain, SOB, DOE, LE edema, palpitations or syncope. He walks for exercise.    Past Medical History  Diagnosis Date  . Hypercholesteremia   . Coronary atherosclerosis of native coronary artery   . CAD (coronary artery disease)     status post PCI of the LAD diagonal with residual nonobstructive disease of the RCA/80% proximal stenosis a very small ramus  . Dyslipidemia   . Bruit     Past Surgical History  Procedure Laterality Date  . Cardiac catheterization    . Coronary angioplasty  2004    x 3 coronary stents  . Tonsillectomy      as child  . Colonoscopy  08/2014  . Inguinal hernia repair Bilateral 10/19/2014    Procedure: LAPAROSCOPIC BILATERAL INGUINAL HERNIA REPAIR WITH MESH;  Surgeon: Avel Peace, MD;  Location: [facility];  Service: General;  Laterality: Bilateral;  . Insertion of mesh Bilateral 10/19/2014    Procedure: INSERTION OF MESH;  Surgeon: Avel Peace, MD;  Location: [facility];  Service: General;  Laterality: Bilateral;     Current Outpatient Prescriptions  Medication Sig Dispense Refill  . atorvastatin (LIPITOR) 40 MG tablet TAKE ONE (1) TABLET BY MOUTH EVERY DAY AT 6PM 30 tablet 5  . clopidogrel (PLAVIX) 75 MG tablet TAKE ONE (1) TABLET BY MOUTH EVERY DAY 30 tablet 1  . vitamin C (ASCORBIC ACID) 500 MG tablet Take 500 mg by mouth daily.     No current facility-administered medications for this visit.    Allergies:   Review of patient's allergies indicates no known allergies.    Social History:  The patient  reports that he quit smoking about 39  years ago. He does not have any smokeless tobacco history on file. He reports that he does not drink alcohol or use illicit drugs.   Family History:  The patient's family history includes Diabetes in his sister; Heart attack in his father; Heart disease in his father and mother; Pulmonary embolism in his father.    ROS:  Please see the history of present illness.   Otherwise, review of systems are positive for none.   All other systems are reviewed and negative.    PHYSICAL EXAM: VS:  BP 102/60 mmHg  Pulse 63  Ht  (1.727 m)  Wt 64.32 kg (141 lb 12.8 oz)  BMI 21.57 kg/m2  SpO2 99% , BMI Body mass index is 21.57 kg/(m^2). GEN: Well nourished, well developed, in no acute distress HEENT: normal Neck: no JVD, carotid bruits, or masses Cardiac: RRR; no murmurs, rubs, or gallops,no edema  Respiratory:  clear to auscultation bilaterally, normal work of breathing GI: soft, nontender, nondistended, + BS MS: no deformity or atrophy Skin: warm and dry, no rash Neuro:  Strength and sensation are intact Psych: euthymic mood, full affect   EKG:  EKG is not ordered today.    Recent Labs: 10/11/2014: ALT 16*; BUN 15; Creatinine, Ser 0.75; Hemoglobin 10.9*; Platelets 225; Potassium 3.8; Sodium  139    Lipid Panel    Component Value Date/Time   CHOL 86 10/12/2013 0749   TRIG 20.0 10/12/2013 0749   HDL 33.70* 10/12/2013 0749   CHOLHDL 3 10/12/2013 0749   VLDL 4.0 10/12/2013 0749   LDLCALC 48 10/12/2013 0749      Wt Readings from Last 3 Encounters:  11/23/14 64.32 kg (141 lb 12.8 oz)  10/19/14 62.596 kg (138 lb)  10/11/14 62.596 kg (138 lb)    ASSESSMENT AND PLAN:  1. ASCAD with no angina - continue ASA/Plavix 2. Dyslipidemia - LDL was 48 10/2013 - continue Atorvastatin  - check FLP and ALT from PCP   Current medicines are reviewed at length with the patient today.  The patient does not have concerns regarding medicines.  The following changes  have been made:  no change  Labs/ tests ordered today: See above Assessment and Plan No orders of the defined types were placed in this encounter.     Disposition:   FU with me in 1 year  Signed, Quintella ReichertURNER,Umer Harig R, MD  11/23/2014 2:00 PM    [facility] [address], [place], [place]  [address] Phone: [phone]) [phone]; Fax: [phone]) [phone]

## 2014-11-23 NOTE — Patient Instructions (Signed)

## 2014-11-25 NOTE — Addendum Note (Signed)
Addended by: Armanda MagicURNER, TRACI R on: 11/25/2014 07:23 PM   Modules accepted: Kipp BroodSmartSet

## 2015-01-09 ENCOUNTER — Other Ambulatory Visit: Payer: Self-pay | Admitting: Cardiology

## 2015-05-12 ENCOUNTER — Other Ambulatory Visit: Payer: Self-pay | Admitting: Cardiology

## 2015-07-17 ENCOUNTER — Other Ambulatory Visit: Payer: Self-pay | Admitting: Cardiology

## 2015-12-03 ENCOUNTER — Ambulatory Visit: Payer: BC Managed Care – PPO | Admitting: Cardiology

## 2016-01-08 ENCOUNTER — Other Ambulatory Visit: Payer: Self-pay | Admitting: Cardiology

## 2016-01-14 ENCOUNTER — Encounter: Payer: Self-pay | Admitting: *Deleted

## 2016-01-29 ENCOUNTER — Encounter: Payer: Self-pay | Admitting: Cardiology

## 2016-01-29 ENCOUNTER — Ambulatory Visit (INDEPENDENT_AMBULATORY_CARE_PROVIDER_SITE_OTHER): Payer: BC Managed Care – PPO | Admitting: Cardiology

## 2016-01-29 VITALS — BP 120/60 | HR 66 | Ht 69.0 in | Wt 140.8 lb

## 2016-01-29 DIAGNOSIS — E785 Hyperlipidemia, unspecified: Secondary | ICD-10-CM

## 2016-01-29 DIAGNOSIS — I2510 Atherosclerotic heart disease of native coronary artery without angina pectoris: Secondary | ICD-10-CM | POA: Diagnosis not present

## 2016-01-29 NOTE — Patient Instructions (Signed)
Medication Instructions:  Your physician recommends that you continue on your current medications as directed. Please refer to the Current Medication list given to you today.   Labwork: Your physician recommends that you return for FASTING lab work.   Testing/Procedures: NOne  Follow-Up: Your physician wants you to follow-up in: 1 year with Dr. Mayford Knifeurner. You will receive a reminder letter in the mail two months in advance. If you don't receive a letter, please call our office to schedule the follow-up appointment.   Any Other Special Instructions Will Be Listed Below (If Applicable).     If you need a refill on your cardiac medications before your next appointment, please call your pharmacy.

## 2016-01-29 NOTE — Progress Notes (Signed)
Cardiology Office Note    Date:  01/29/2016   ID:  Donald BrandGlenn R Sehgal, DOB [birth year], MRN [id number]  PCP:  Lolita PatellaEADE,ROBERT ALEXANDER, MD  Cardiologist:  Armanda Magicraci Lakeyta Vandenheuvel, MD   Chief Complaint  Patient presents with  . Coronary Artery Disease  . Hyperlipidemia    History of Present Illness:  Donald Fleming is a 61 y.o. male with a history of ASCAD, HTN and dyslipidemia who presents today for followup. He denies any chest pain, SOB, DOE, LE edema, palpitations, PND, orthopnea or syncope. He denies any claudication.  He walks some for exercise.   Past Medical History:  Diagnosis Date  . Bruit   . CAD (coronary artery disease)    status post PCI of the LAD diagonal with residual nonobstructive disease of the RCA/80% proximal stenosis a very small ramus  . Dyslipidemia     Past Surgical History:  Procedure Laterality Date  . CARDIAC CATHETERIZATION    . COLONOSCOPY  08/2014  . CORONARY ANGIOPLASTY  2004   x 3 coronary stents  . INGUINAL HERNIA REPAIR Bilateral 10/19/2014   Procedure: LAPAROSCOPIC BILATERAL INGUINAL HERNIA REPAIR WITH MESH;  Surgeon: Avel Peaceodd Rosenbower, MD;  Location: [facility];  Service: General;  Laterality: Bilateral;  . INSERTION OF MESH Bilateral 10/19/2014   Procedure: INSERTION OF MESH;  Surgeon: Avel Peaceodd Rosenbower, MD;  Location: [facility];  Service: General;  Laterality: Bilateral;  . TONSILLECTOMY     as child    Current Medications: Outpatient Medications Prior to Visit  Medication Sig Dispense Refill  . atorvastatin (LIPITOR) 40 MG tablet TAKE ONE (1) TABLET BY MOUTH EVERY DAY AT 6:00PM 30 tablet 3  . clopidogrel (PLAVIX) 75 MG tablet TAKE ONE (1) TABLET BY MOUTH EVERY DAY 30 tablet 11   ASA 81mg  daily    . vitamin C (ASCORBIC ACID) 500 MG tablet Take 500 mg by mouth daily.     No facility-administered medications prior to visit.      Allergies:   Patient has no known allergies.   Social History   Social History  . Marital status: Married    Spouse  name: N/A  . Number of children: N/A  . Years of education: N/A   Social History Main Topics  . Smoking status: Former Smoker    Quit date: 10/18/1975  . Smokeless tobacco: None     Comment: smoked in College  . Alcohol use No  . Drug use: No  . Sexual activity: Not Asked   Other Topics Concern  . None   Social History Narrative  . None     Family History:  The patient's family history includes Diabetes in his sister; Heart attack in his father; Heart disease in his father and mother; Pulmonary embolism in his father.   ROS:   Please see the history of present illness.    ROS All other systems reviewed and are negative.  No flowsheet data found.     PHYSICAL EXAM:   VS:  BP 120/60   Pulse 66   Ht 5\' 9"  (1.753 m)   Wt 140 lb 12.8 oz (63.9 kg)   BMI 20.79 kg/m    GEN: Well nourished, well developed, in no acute distress  HEENT: normal  Neck: no JVD, carotid bruits, or masses Cardiac: RRR; no murmurs, rubs, or gallops,no edema.  Intact distal pulses bilaterally.  Respiratory:  clear to auscultation bilaterally, normal work of breathing GI: soft, nontender, nondistended, + BS MS: no deformity or atrophy  Skin:  warm and dry, no rash Neuro:  Alert and Oriented x 3, Strength and sensation are intact Psych: euthymic mood, full affect  Wt Readings from Last 3 Encounters:  01/29/16 140 lb 12.8 oz (63.9 kg)  11/23/14 141 lb 12.8 oz (64.3 kg)  10/19/14 138 lb (62.6 kg)      Studies/Labs Reviewed:   EKG:  EKG is  ordered today.  The ekg ordered today demonstrates NSR at 72bpm with no ST changes and IRBBB  Recent Labs: No results found for requested labs within last 8760 hours.   Lipid Panel    Component Value Date/Time   CHOL 86 10/12/2013 0749   TRIG 20.0 10/12/2013 0749   HDL 33.70 (L) 10/12/2013 0749   CHOLHDL 3 10/12/2013 0749   VLDL 4.0 10/12/2013 0749   LDLCALC 48 10/12/2013 0749    Additional studies/ records that were reviewed today include:   none    ASSESSMENT:    1. Coronary artery disease involving native coronary artery of native heart without angina pectoris   2. Dyslipidemia      PLAN:  In order of problems listed above:  1. ASCAD status post PCI of the LAD diagonal with residual nonobstructive disease of the RCA and 80% proximal stenosis a very small ramus.  He has not had any anginal symptoms. Continue ASA/Plavix.   2. Dyslipidemia with LDL goal < 70. Continue statin.  Check FLP and ALT.    Medication Adjustments/Labs and Tests Ordered: Current medicines are reviewed at length with the patient today.  Concerns regarding medicines are outlined above.  Medication changes, Labs and Tests ordered today are listed in the Patient Instructions below.  There are no Patient Instructions on file for this visit.   Signed, Armanda Magic, MD  01/29/2016 9:40 AM    [facility] [address] [place], [place], [place]  [address] Phone: [phone]; Fax: [phone]

## 2016-01-30 ENCOUNTER — Other Ambulatory Visit: Payer: BC Managed Care – PPO | Admitting: *Deleted

## 2016-01-30 DIAGNOSIS — E785 Hyperlipidemia, unspecified: Secondary | ICD-10-CM

## 2016-01-31 LAB — LIPID PANEL
CHOL/HDL RATIO: 2.6 ratio (ref 0.0–5.0)
Cholesterol, Total: 95 mg/dL — ABNORMAL LOW (ref 100–199)
HDL: 37 mg/dL — AB (ref >39)
LDL Calculated: 50 mg/dL (ref 0–99)
TRIGLYCERIDES: 39 mg/dL (ref 0–149)
VLDL Cholesterol Cal: 8 mg/dL (ref 5–40)

## 2016-01-31 LAB — HEPATIC FUNCTION PANEL
ALBUMIN: 3.5 g/dL — AB (ref 3.6–4.8)
ALT: 13 IU/L (ref 0–44)
AST: 13 IU/L (ref 0–40)
Alkaline Phosphatase: 88 IU/L (ref 39–117)
Bilirubin Total: 0.5 mg/dL (ref 0.0–1.2)
Bilirubin, Direct: 0.17 mg/dL (ref 0.00–0.40)
Total Protein: 7.3 g/dL (ref 6.0–8.5)

## 2016-02-05 ENCOUNTER — Other Ambulatory Visit: Payer: Self-pay | Admitting: Cardiology

## 2016-05-28 ENCOUNTER — Other Ambulatory Visit: Payer: Self-pay | Admitting: Cardiology

## 2017-02-03 ENCOUNTER — Encounter: Payer: Self-pay | Admitting: Cardiology

## 2017-02-03 NOTE — Progress Notes (Signed)
Cardiology Office Note:    Date:  02/06/2017   ID:  Donald Fleming, DOB [birth year], MRN [id number]  PCP:  Elias Elseeade, Robert, MD  Cardiologist:  No primary care provider on file.    Referring MD: Elias Elseeade, Robert, MD   Chief Complaint  Patient presents with  . Coronary Artery Disease  . Hyperlipidemia    History of Present Illness:    Donald Fleming is a 62 y.o. male with a hx of  ASCAD, HTN and dyslipidemia.  He is status post PCI of the LAD diagonal with residual nonobstructive disease of the RCA and 80% proximal stenosis of a very small ramus. He is here today for followup and is doing well.  He denies any chest pain or pressure, SOB, DOE, PND, orthopnea, LE edema, dizziness, palpitations or syncope. He is compliant with his meds and is tolerating meds with no SE.     Past Medical History:  Diagnosis Date  . Bruit   . CAD (coronary artery disease)    status post PCI of the LAD diagonal with residual nonobstructive disease of the RCA/80% proximal stenosis a very small ramus  . Dyslipidemia     Past Surgical History:  Procedure Laterality Date  . CARDIAC CATHETERIZATION    . COLONOSCOPY  08/2014  . CORONARY ANGIOPLASTY  2004   x 3 coronary stents  . INGUINAL HERNIA REPAIR Bilateral 10/19/2014   Procedure: LAPAROSCOPIC BILATERAL INGUINAL HERNIA REPAIR WITH MESH;  Surgeon: Avel Peaceodd Rosenbower, MD;  Location: [facility];  Service: General;  Laterality: Bilateral;  . INSERTION OF MESH Bilateral 10/19/2014   Procedure: INSERTION OF MESH;  Surgeon: Avel Peaceodd Rosenbower, MD;  Location: [facility];  Service: General;  Laterality: Bilateral;  . TONSILLECTOMY     as child    Current Medications: Current Meds  Medication Sig  . aspirin EC 81 MG tablet Take 81 mg by mouth daily.  Marland Kitchen. atorvastatin (LIPITOR) 40 MG tablet TAKE ONE (1) TABLET BY MOUTH EVERY DAY AT SIX IN THE EVENING  . clopidogrel (PLAVIX) 75 MG tablet TAKE ONE (1) TABLET BY MOUTH EVERY DAY  . OVER THE COUNTER MEDICATION Echinacea premium  herbal supplement  . OVER THE COUNTER MEDICATION Total probiotics supplement  . vitamin C (ASCORBIC ACID) 500 MG tablet Take 500 mg by mouth daily.     Allergies:   Patient has no known allergies.   Social History   Socioeconomic History  . Marital status: Married    Spouse name: None  . Number of children: None  . Years of education: None  . Highest education level: None  Social Needs  . Financial resource strain: None  . Food insecurity - worry: None  . Food insecurity - inability: None  . Transportation needs - medical: None  . Transportation needs - non-medical: None  Occupational History  . None  Tobacco Use  . Smoking status: Former Smoker    Last attempt to quit: 10/18/1975    Years since quitting: 41.3  . Smokeless tobacco: Never Used  . Tobacco comment: smoked in College  Substance and Sexual Activity  . Alcohol use: No  . Drug use: No  . Sexual activity: None  Other Topics Concern  . None  Social History Narrative  . None     Family History: The patient's family history includes Diabetes in his sister; Heart attack in his father; Heart disease in his father and mother; Pulmonary embolism in his father.  ROS:   Please see the history of present  illness.    ROS  All other systems reviewed and negative.   EKGs/Labs/Other Studies Reviewed:    The following studies were reviewed today: none  EKG:  EKG is ordered today and showed NSR at 60bpm with no ST changes  Recent Labs: No results found for requested labs within last 8760 hours.   Recent Lipid Panel    Component Value Date/Time   CHOL 95 (L) 01/30/2016 0811   TRIG 39 01/30/2016 0811   HDL 37 (L) 01/30/2016 0811   CHOLHDL 2.6 01/30/2016 0811   CHOLHDL 3 10/12/2013 0749   VLDL 4.0 10/12/2013 0749   LDLCALC 50 01/30/2016 0811    Physical Exam:    VS:  BP (!) 106/52   Pulse 64   Ht 5\' 9"  (1.753 m)   Wt 132 lb 9.6 oz (60.1 kg)   BMI 19.58 kg/m     Wt Readings from Last 3 Encounters:    02/06/17 132 lb 9.6 oz (60.1 kg)  01/29/16 140 lb 12.8 oz (63.9 kg)  11/23/14 141 lb 12.8 oz (64.3 kg)     GEN:  Well nourished, well developed in no acute distress HEENT: Normal NECK: No JVD; No carotid bruits LYMPHATICS: No lymphadenopathy CARDIAC: RRR, no murmurs, rubs, gallops RESPIRATORY:  Clear to auscultation without rales, wheezing or rhonchi  ABDOMEN: Soft, non-tender, non-distended MUSCULOSKELETAL:  No edema; No deformity  SKIN: Warm and dry NEUROLOGIC:  Alert and oriented x 3 PSYCHIATRIC:  Normal affect   ASSESSMENT:    1. Coronary artery disease involving native coronary artery of native heart without angina pectoris   2. Dyslipidemia    PLAN:    In order of problems listed above:  1  ASCAD - status post PCI of the LAD diagonal with residual nonobstructive disease of the RCA/80% proximal stenosis a very small ramus.  He denies any chest pain or SOB.  He will continue on statin, ASA and Plavix.  2.  Hyperlipidemia - LDL goal < 70.  He will continue on atorvastatin 40mg  daily.  I will get an FLP and ALT. He just joined the [employer] and plans to start an exercise program. I   Medication Adjustments/Labs and Tests Ordered: Current medicines are reviewed at length with the patient today.  Concerns regarding medicines are outlined above.  Orders Placed This Encounter  Procedures  . EKG 12-Lead   No orders of the defined types were placed in this encounter.   Signed, Armanda Magic, MD  02/06/2017 8:18 AM    [facility]

## 2017-02-06 ENCOUNTER — Encounter: Payer: Self-pay | Admitting: Cardiology

## 2017-02-06 ENCOUNTER — Ambulatory Visit: Payer: BC Managed Care – PPO | Admitting: Cardiology

## 2017-02-06 VITALS — BP 106/52 | HR 64 | Ht 69.0 in | Wt 132.6 lb

## 2017-02-06 DIAGNOSIS — I2510 Atherosclerotic heart disease of native coronary artery without angina pectoris: Secondary | ICD-10-CM

## 2017-02-06 DIAGNOSIS — E785 Hyperlipidemia, unspecified: Secondary | ICD-10-CM

## 2017-02-06 LAB — LIPID PANEL
CHOL/HDL RATIO: 2.2 ratio (ref 0.0–5.0)
CHOLESTEROL TOTAL: 72 mg/dL — AB (ref 100–199)
HDL: 33 mg/dL — ABNORMAL LOW (ref >39)
LDL CALC: 33 mg/dL (ref 0–99)
TRIGLYCERIDES: 29 mg/dL (ref 0–149)
VLDL CHOLESTEROL CAL: 6 mg/dL (ref 5–40)

## 2017-02-06 LAB — HEPATIC FUNCTION PANEL
ALBUMIN: 3.3 g/dL — AB (ref 3.6–4.8)
ALK PHOS: 92 IU/L (ref 39–117)
ALT: 15 IU/L (ref 0–44)
AST: 12 IU/L (ref 0–40)
BILIRUBIN, DIRECT: 0.19 mg/dL (ref 0.00–0.40)
Bilirubin Total: 0.4 mg/dL (ref 0.0–1.2)
TOTAL PROTEIN: 7.1 g/dL (ref 6.0–8.5)

## 2017-02-06 NOTE — Patient Instructions (Signed)
Medication Instructions:  Your physician recommends that you continue on your current medications as directed. Please refer to the Current Medication list given to you today.  Labwork: Today for liver function and fasting lipids.  Testing/Procedures: None ordered   Follow-Up: Your physician wants you to follow-up in: 1 year with Dr. Turner. You will receive a reminder letter in the mail two months in advance. If you don't receive a letter, please call our office to schedule the follow-up appointment.   Any Other Special Instructions Will Be Listed Below (If Applicable).     If you need a refill on your cardiac medications before your next appointment, please call your pharmacy.  

## 2017-02-16 ENCOUNTER — Other Ambulatory Visit: Payer: Self-pay | Admitting: Cardiology

## 2018-01-28 ENCOUNTER — Encounter: Payer: Self-pay | Admitting: Physician Assistant

## 2018-02-16 ENCOUNTER — Encounter: Payer: Self-pay | Admitting: Physician Assistant

## 2018-02-16 NOTE — Progress Notes (Signed)
Cardiology Office Note    Date:  02/17/2018  ID:  Donald FothergillGlenn R Fleming, DOB 1955-02-04, MRN [id number] PCP:  Elias Elseeade, Robert, MD  Cardiologist:  Armanda Magicraci Turner, MD   Chief Complaint: 1 year follow up of CAD  History of Present Illness:  Donald Fleming (pronounced HAY-nos - [place]) is a 63 y.o. male with history of CAD, HTN, IRBB, dyslipidemia. Per Dr .Norris Crossurner's notes, he is status post PCI of the LAD diagonal with residual nonobstructive disease of the RCA and 80% proximal stenosis of a very small ramus. I do not have a prior cath report available but he reports [facility]. Last labs 02/2017 showed albumin 3.3 otherwise normal LFTs, LDL 33, 2016 Cr 0.75, Hgb 40.910.9. Carotid duplex 2013 <20% stenosis bilaterally.  He returns for routine follow-up doing well without any recent CP, SOB, palpitations or syncope. Since the winter time when the weather changed he has noticed some dizziness that happens in the morning upon waking and standing up. It takes him a few minutes for his balance to even out. He also feels dizzy if he bends over and stands up quickly. It's maybe gotten a little better recently. He does not feel it is related to his heart. No tunnel vision, diaphoresis, near-syncope or associated palpitations.He has not had any recent bloodwork and has not followed recently anywhere with a PCP. [place] [facility] [place] [facility] [place] and will be looking for a cardiologist more local but for the meantime is still willing to travel to [place] as his wife also comes out here for her appointments.   Past Medical History:  Diagnosis Date  . Anemia 2016  . Bruit   . CAD (coronary artery disease)    status post PCI of the LAD diagonal with residual nonobstructive disease of the RCA/80% proximal stenosis a very small ramus  . Dyslipidemia   . Incomplete right bundle branch block     Past Surgical History:  Procedure Laterality Date  . CARDIAC CATHETERIZATION    . COLONOSCOPY  08/2014  .  CORONARY ANGIOPLASTY  2004   x 3 coronary stents  . INGUINAL HERNIA REPAIR Bilateral 10/19/2014   Procedure: LAPAROSCOPIC BILATERAL INGUINAL HERNIA REPAIR WITH MESH;  Surgeon: Avel Peaceodd Rosenbower, MD;  Location: [facility];  Service: General;  Laterality: Bilateral;  . INSERTION OF MESH Bilateral 10/19/2014   Procedure: INSERTION OF MESH;  Surgeon: Avel Peaceodd Rosenbower, MD;  Location: [facility];  Service: General;  Laterality: Bilateral;  . TONSILLECTOMY     as child    Current Medications: Current Meds  Medication Sig  . aspirin EC 81 MG tablet Take 81 mg by mouth daily.  Marland Kitchen. atorvastatin (LIPITOR) 40 MG tablet TAKE ONE (1) TABLET BY MOUTH EVERY DAY AT 6:00 IN THE EVENING  . clopidogrel (PLAVIX) 75 MG tablet TAKE ONE (1) TABLET BY MOUTH EVERY DAY  . OVER THE COUNTER MEDICATION Echinacea premium herbal supplement  . OVER THE COUNTER MEDICATION Total probiotics supplement  . vitamin C (ASCORBIC ACID) 500 MG tablet Take 500 mg by mouth daily.      Allergies:   Patient has no known allergies.   Social History   Socioeconomic History  . Marital status: Married    Spouse name: Not on file  . Number of children: Not on file  . Years of education: Not on file  . Highest education level: Not on file  Occupational History  . Not on file  Social Needs  . Financial resource strain:  Not on file  . Food insecurity:    Worry: Not on file    Inability: Not on file  . Transportation needs:    Medical: Not on file    Non-medical: Not on file  Tobacco Use  . Smoking status: Former Smoker    Last attempt to quit: 10/18/1975    Years since quitting: 42.3  . Smokeless tobacco: Never Used  . Tobacco comment: smoked in College  Substance and Sexual Activity  . Alcohol use: No  . Drug use: No  . Sexual activity: Not on file  Lifestyle  . Physical activity:    Days per week: Not on file    Minutes per session: Not on file  . Stress: Not on file  Relationships  . Social connections:    Talks on phone:  Not on file    Gets together: Not on file    Attends religious service: Not on file    Active member of club or organization: Not on file    Attends meetings of clubs or organizations: Not on file    Relationship status: Not on file  Other Topics Concern  . Not on file  Social History Narrative  . Not on file     Family History:  The patient's family history includes Diabetes in his sister; Heart attack in his father; Heart disease in his father and mother; Pulmonary embolism in his father.  ROS:   Please see the history of present illness.  All other systems are reviewed and otherwise negative.    PHYSICAL EXAM:   VS:  BP (!) 114/58   Pulse 61   Ht 5\' 9"  (1.753 m)   Wt 134 lb 12.8 oz (61.1 kg)   SpO2 99%   BMI 19.91 kg/m   BMI: Body mass index is 19.91 kg/m. GEN: Well nourished, well developed thin WM in no acute distress HEENT: normocephalic, atraumatic Neck: no JVD or masses. Soft R carotid bruit. Cardiac: RRR very soft SEM, no rubs or gallops, no edema  Respiratory:  clear to auscultation bilaterally, normal work of breathing GI: soft, nontender, nondistended, + BS MS: no deformity or atrophy Skin: warm and dry, no rash Neuro:  Alert and Oriented x 3, Strength and sensation are intact, follows commands Psych: euthymic mood, full affect  Wt Readings from Last 3 Encounters:  02/17/18 134 lb 12.8 oz (61.1 kg)  02/06/17 132 lb 9.6 oz (60.1 kg)  01/29/16 140 lb 12.8 oz (63.9 kg)      Studies/Labs Reviewed:   EKG:  EKG was ordered today and personally reviewed by me and demonstrates NSR 61bpm, occasional fusion beat, rightward axis, IRBBB, non-specific ST-T changes. TWI noted in V3 but suspected to be lead placement.  Recent Labs: No results found for requested labs within last 8760 hours.   Lipid Panel    Component Value Date/Time   CHOL 72 (L) 02/06/2017 0823   TRIG 29 02/06/2017 0823   HDL 33 (L) 02/06/2017 0823   CHOLHDL 2.2 02/06/2017 0823   CHOLHDL 3  10/12/2013 0749   VLDL 4.0 10/12/2013 0749   LDLCALC 33 02/06/2017 0823    Additional studies/ records that were reviewed today include: Summarized above   ASSESSMENT & PLAN:   1. CAD - asymptomatic. He has been maintained on long-term DAPT and is doing well. He ate pizza today. He will return tomorrow AM for fasting routine labs to include CBC, CMET, and lipid profile. 2. IRBBB - noted on EKG, chronic. He  has not noticed any bradycardia. 3. Anemia in 2016 labs - recheck today given reports of dizziness. 4. Dyslipidemia - recheck in AM. 5. R carotid bruit - mild, ? Radiation from murmur. Patient denies prior knowledge of this. Will obtain carotid duplex. 6. Systolic murmur - also very mild. He denies prior knowledge of this. Will obtain echocardiogram to clarify what this represents. Does not sound particularly severe, question mild aortic stenosis or sclerosis. 7. Dizziness - he reports that he does not hydrate well which may be contributing to some mild orthostasis. Check labs including thyroid function and studies above. If this persists despite regular hydration may require further workup. He will keep [place] informed if this does not resolve with increased hydration. He is not on any antihypertensives to hold.  Disposition: He will be establishing local cardiology care in [facility] but we will offer a 1 year recall here as well if he has trouble establishing care there.  Medication Adjustments/Labs and Tests Ordered: Current medicines are reviewed at length with the patient today.  Concerns regarding medicines are outlined above. Medication changes, Labs and Tests ordered today are summarized above and listed in the Patient Instructions accessible in Encounters.   Signed, Laurann Montana, PA-C  02/17/2018 2:51 PM    [facility] [address] [place], [place], [place]  [address] Phone: [phone]; Fax: [phone]

## 2018-02-17 ENCOUNTER — Ambulatory Visit: Payer: BC Managed Care – PPO | Admitting: Physician Assistant

## 2018-02-17 ENCOUNTER — Encounter: Payer: Self-pay | Admitting: Physician Assistant

## 2018-02-17 VITALS — BP 114/58 | HR 61 | Ht 69.0 in | Wt 134.8 lb

## 2018-02-17 DIAGNOSIS — R42 Dizziness and giddiness: Secondary | ICD-10-CM

## 2018-02-17 DIAGNOSIS — E785 Hyperlipidemia, unspecified: Secondary | ICD-10-CM | POA: Diagnosis not present

## 2018-02-17 DIAGNOSIS — R0989 Other specified symptoms and signs involving the circulatory and respiratory systems: Secondary | ICD-10-CM

## 2018-02-17 DIAGNOSIS — R011 Cardiac murmur, unspecified: Secondary | ICD-10-CM

## 2018-02-17 DIAGNOSIS — I4510 Unspecified right bundle-branch block: Secondary | ICD-10-CM | POA: Diagnosis not present

## 2018-02-17 DIAGNOSIS — I2510 Atherosclerotic heart disease of native coronary artery without angina pectoris: Secondary | ICD-10-CM | POA: Diagnosis not present

## 2018-02-17 DIAGNOSIS — D649 Anemia, unspecified: Secondary | ICD-10-CM | POA: Diagnosis not present

## 2018-02-17 NOTE — Patient Instructions (Addendum)
Medication Instructions:  Your physician recommends that you continue on your current medications as directed. Please refer to the Current Medication list given to you today.  If you need a refill on your cardiac medications before your next appointment, please call your pharmacy.   Lab work: TOMORROW, 02/18/2018:  FASTING LIPID, CMET, CBC, & TSH   If you have labs (blood work) drawn today and your tests are completely normal, you will receive your results only by: Marland Kitchen MyChart Message (if you have MyChart) OR . A paper copy in the mail If you have any lab test that is abnormal or we need to change your treatment, we will call you to review the results.  Testing/Procedures: Your physician has requested that you have an echocardiogram. Echocardiography is a painless test that uses sound waves to create images of your heart. It provides your doctor with information about the size and shape of your heart and how well your heart's chambers and valves are working. This procedure takes approximately one hour. There are no restrictions for this procedure.  Your physician has requested that you have a carotid duplex. This test is an ultrasound of the carotid arteries in your neck. It looks at blood flow through these arteries that supply the brain with blood. Allow one hour for this exam. There are no restrictions or special instructions.   Follow-Up: At [facility], you and your health needs are our priority.  As part of our continuing mission to provide you with exceptional heart care, we have created designated Provider Care Teams.  These Care Teams include your primary Cardiologist (physician) and Advanced Practice Providers (APPs -  Physician Assistants and Nurse Practitioners) who all work together to provide you with the care you need, when you need it. You will need a follow up appointment in 12  months.  Please call our office 2 months in advance to schedule this appointment.  You may see Armanda Magic, MD or one of the following Advanced Practice Providers on your designated Care Team:   Turner, PA-C Ronie Spies, PA-C . Jacolyn Reedy, PA-C  Any Other Special Instructions Will Be Listed Below (If Applicable).  Echocardiogram An echocardiogram is a procedure that uses painless sound waves (ultrasound) to produce an image of the heart. Images from an echocardiogram can provide important information about:  Signs of coronary artery disease (CAD).  Aneurysm detection. An aneurysm is a weak or damaged part of an artery wall that bulges out from the normal force of blood pumping through the body.  Heart size and shape. Changes in the size or shape of the heart can be associated with certain conditions, including heart failure, aneurysm, and CAD.  Heart muscle function.  Heart valve function.  Signs of a past heart attack.  Fluid buildup around the heart.  Thickening of the heart muscle.  A tumor or infectious growth around the heart valves. Tell a health care provider about:  Any allergies you have.  All medicines you are taking, including vitamins, herbs, eye drops, creams, and over-the-counter medicines.  Any blood disorders you have.  Any surgeries you have had.  Any medical conditions you have.  Whether you are pregnant or may be pregnant. What are the risks? Generally, this is a safe procedure. However, problems may occur, including:  Allergic reaction to dye (contrast) that may be used during the procedure. What happens before the procedure? No specific preparation is needed. You may eat and drink normally. What happens during the  procedure?   An IV tube may be inserted into one of your veins.  You may receive contrast through this tube. A contrast is an injection that improves the quality of the pictures from your heart.  A gel will be applied to your chest.  A wand-like tool (transducer) will be moved over your chest. The gel will help to  transmit the sound waves from the transducer.  The sound waves will harmlessly bounce off of your heart to allow the heart images to be captured in real-time motion. The images will be recorded on a computer. The procedure may vary among health care providers and hospitals. What happens after the procedure?  You may return to your normal, everyday life, including diet, activities, and medicines, unless your health care provider tells you not to do that. Summary  An echocardiogram is a procedure that uses painless sound waves (ultrasound) to produce an image of the heart.  Images from an echocardiogram can provide important information about the size and shape of your heart, heart muscle function, heart valve function, and fluid buildup around your heart.  You do not need to do anything to prepare before this procedure. You may eat and drink normally.  After the echocardiogram is completed, you may return to your normal, everyday life, unless your health care provider tells you not to do that. This information is not intended to replace advice given to you by your health care provider. Make sure you discuss any questions you have with your health care provider. Document Released: 12/21/1999 Document Revised: 01/26/2016 Document Reviewed: 01/26/2016 Elsevier Interactive Patient Education  2019 [employer].

## 2018-02-18 ENCOUNTER — Other Ambulatory Visit: Payer: BC Managed Care – PPO | Admitting: *Deleted

## 2018-02-18 ENCOUNTER — Ambulatory Visit (HOSPITAL_COMMUNITY): Payer: BC Managed Care – PPO | Attending: Cardiology

## 2018-02-18 DIAGNOSIS — D649 Anemia, unspecified: Secondary | ICD-10-CM | POA: Diagnosis not present

## 2018-02-18 DIAGNOSIS — R42 Dizziness and giddiness: Secondary | ICD-10-CM

## 2018-02-18 DIAGNOSIS — I4510 Unspecified right bundle-branch block: Secondary | ICD-10-CM | POA: Insufficient documentation

## 2018-02-18 DIAGNOSIS — I2510 Atherosclerotic heart disease of native coronary artery without angina pectoris: Secondary | ICD-10-CM | POA: Diagnosis present

## 2018-02-18 DIAGNOSIS — R011 Cardiac murmur, unspecified: Secondary | ICD-10-CM | POA: Diagnosis present

## 2018-02-18 DIAGNOSIS — R0989 Other specified symptoms and signs involving the circulatory and respiratory systems: Secondary | ICD-10-CM

## 2018-02-18 DIAGNOSIS — E785 Hyperlipidemia, unspecified: Secondary | ICD-10-CM | POA: Diagnosis present

## 2018-02-18 LAB — COMPREHENSIVE METABOLIC PANEL
ALBUMIN: 3.3 g/dL — AB (ref 3.8–4.8)
ALT: 10 IU/L (ref 0–44)
AST: 11 IU/L (ref 0–40)
Albumin/Globulin Ratio: 0.8 — ABNORMAL LOW (ref 1.2–2.2)
Alkaline Phosphatase: 85 IU/L (ref 39–117)
BILIRUBIN TOTAL: 0.5 mg/dL (ref 0.0–1.2)
BUN / CREAT RATIO: 18 (ref 10–24)
BUN: 15 mg/dL (ref 8–27)
CO2: 23 mmol/L (ref 20–29)
Calcium: 8.2 mg/dL — ABNORMAL LOW (ref 8.6–10.2)
Chloride: 103 mmol/L (ref 96–106)
Creatinine, Ser: 0.84 mg/dL (ref 0.76–1.27)
GFR calc non Af Amer: 93 mL/min/{1.73_m2} (ref >59)
GFR, EST AFRICAN AMERICAN: 107 mL/min/{1.73_m2} (ref >59)
Globulin, Total: 4.1 g/dL (ref 1.5–4.5)
Glucose: 93 mg/dL (ref 65–99)
Potassium: 4.6 mmol/L (ref 3.5–5.2)
Sodium: 138 mmol/L (ref 134–144)
TOTAL PROTEIN: 7.4 g/dL (ref 6.0–8.5)

## 2018-02-18 LAB — CBC
Hematocrit: 30.5 % — ABNORMAL LOW (ref 37.5–51.0)
Hemoglobin: 10.4 g/dL — ABNORMAL LOW (ref 13.0–17.7)
MCH: 30.8 pg (ref 26.6–33.0)
MCHC: 34.1 g/dL (ref 31.5–35.7)
MCV: 90 fL (ref 79–97)
Platelets: 271 10*3/uL (ref 150–450)
RBC: 3.38 x10E6/uL — ABNORMAL LOW (ref 4.14–5.80)
RDW: 15.1 % (ref 11.6–15.4)
WBC: 5.5 10*3/uL (ref 3.4–10.8)

## 2018-02-18 LAB — LIPID PANEL
Chol/HDL Ratio: 2.2 ratio (ref 0.0–5.0)
Cholesterol, Total: 86 mg/dL — ABNORMAL LOW (ref 100–199)
HDL: 40 mg/dL (ref >39)
LDL Calculated: 40 mg/dL (ref 0–99)
Triglycerides: 32 mg/dL (ref 0–149)
VLDL Cholesterol Cal: 6 mg/dL (ref 5–40)

## 2018-02-18 LAB — TSH: TSH: 7.69 u[IU]/mL — ABNORMAL HIGH (ref 0.450–4.500)

## 2018-02-19 ENCOUNTER — Telehealth: Payer: Self-pay | Admitting: *Deleted

## 2018-02-19 NOTE — Telephone Encounter (Signed)
Pt has been made aware of his echo / lab results and recommendations given. See result note.

## 2018-02-19 NOTE — Telephone Encounter (Signed)
Follow Up ° ° °Pt is returning phone call  ° °Please call back  °

## 2018-02-19 NOTE — Telephone Encounter (Signed)
-----   Message from Laurann Montana, [place] sent at 02/19/2018  7:56 AM EST ----- Please let patient know echo shows normal left ventricular function. His aortic valve has mild calcification as well as mild-moderate leaking. I suspect this is what is causing his heart murmur. Guidelines would suggest a repeat echo due in 1-2 years so he should discuss with the cardiologist he establishes care with in [facility]. Otherwise see lab note too. Dayna Dunn PA-C

## 2018-02-19 NOTE — Telephone Encounter (Signed)
Called pt re: Echo and lab results, left a message for pt to call back.

## 2018-02-24 ENCOUNTER — Ambulatory Visit (HOSPITAL_COMMUNITY)
Admission: RE | Admit: 2018-02-24 | Discharge: 2018-02-24 | Disposition: A | Discharge status: Home / Self Care | Payer: BC Managed Care – PPO | Source: Ambulatory Visit | Attending: Internal Medicine | Admitting: Internal Medicine

## 2018-02-24 DIAGNOSIS — R0989 Other specified symptoms and signs involving the circulatory and respiratory systems: Secondary | ICD-10-CM | POA: Diagnosis present

## 2018-02-24 DIAGNOSIS — I4510 Unspecified right bundle-branch block: Secondary | ICD-10-CM | POA: Diagnosis present

## 2018-02-24 DIAGNOSIS — I2510 Atherosclerotic heart disease of native coronary artery without angina pectoris: Secondary | ICD-10-CM | POA: Diagnosis not present

## 2018-02-24 DIAGNOSIS — E785 Hyperlipidemia, unspecified: Secondary | ICD-10-CM | POA: Diagnosis not present

## 2018-02-24 DIAGNOSIS — R42 Dizziness and giddiness: Secondary | ICD-10-CM | POA: Diagnosis present

## 2018-02-24 DIAGNOSIS — R011 Cardiac murmur, unspecified: Secondary | ICD-10-CM | POA: Diagnosis present

## 2018-02-24 DIAGNOSIS — D649 Anemia, unspecified: Secondary | ICD-10-CM

## 2018-03-02 MED ORDER — ATORVASTATIN CALCIUM 40 MG PO TABS: ORAL_TABLET | ORAL | 11 refills | Status: DC

## 2018-03-02 MED ORDER — CLOPIDOGREL BISULFATE 75 MG PO TABS: ORAL_TABLET | ORAL | 11 refills | Status: DC

## 2018-03-02 NOTE — Telephone Encounter (Signed)
Appreciate assistance in taking care of this. Thx

## 2018-03-02 NOTE — Telephone Encounter (Signed)
Pt's medications were sent to pt's pharmacy as requested. Confirmation received.  

## 2019-03-14 ENCOUNTER — Other Ambulatory Visit: Payer: Self-pay | Admitting: Physician Assistant

## 2019-04-13 ENCOUNTER — Other Ambulatory Visit: Payer: Self-pay | Admitting: Physician Assistant

## 2019-04-27 ENCOUNTER — Other Ambulatory Visit: Payer: Self-pay | Admitting: Physician Assistant

## 2019-05-19 ENCOUNTER — Other Ambulatory Visit: Payer: Self-pay

## 2019-05-19 MED ORDER — CLOPIDOGREL BISULFATE 75 MG PO TABS: 75.0000 mg | ORAL_TABLET | Freq: Every day | ORAL | 0 refills | Status: AC
